# Patient Record
Sex: Male | Born: 1935
Health system: Southern US, Community
[De-identification: ages and names within clinical notes are randomized; demographics above are authoritative.]

## PROBLEM LIST (undated history)

## (undated) DIAGNOSIS — J439 Emphysema, unspecified: Secondary | ICD-10-CM

## (undated) DIAGNOSIS — K648 Other hemorrhoids: Secondary | ICD-10-CM

## (undated) DIAGNOSIS — E785 Hyperlipidemia, unspecified: Secondary | ICD-10-CM

## (undated) DIAGNOSIS — D649 Anemia, unspecified: Secondary | ICD-10-CM

## (undated) DIAGNOSIS — IMO0001 Reserved for inherently not codable concepts without codable children: Secondary | ICD-10-CM

## (undated) DIAGNOSIS — C61 Malignant neoplasm of prostate: Secondary | ICD-10-CM

## (undated) DIAGNOSIS — J449 Chronic obstructive pulmonary disease, unspecified: Secondary | ICD-10-CM

## (undated) DIAGNOSIS — D126 Benign neoplasm of colon, unspecified: Secondary | ICD-10-CM

## (undated) DIAGNOSIS — H269 Unspecified cataract: Secondary | ICD-10-CM

## (undated) HISTORY — DX: Unspecified cataract: H26.9

## (undated) HISTORY — DX: Emphysema, unspecified: J43.9

## (undated) HISTORY — DX: Anemia, unspecified: D64.9

## (undated) HISTORY — DX: Benign neoplasm of colon, unspecified: D12.6

## (undated) HISTORY — DX: Other hemorrhoids: K64.8

## (undated) HISTORY — DX: Malignant neoplasm of prostate: C61

## (undated) HISTORY — DX: Reserved for inherently not codable concepts without codable children: IMO0001

## (undated) HISTORY — DX: Hyperlipidemia, unspecified: E78.5

## (undated) HISTORY — DX: Chronic obstructive pulmonary disease, unspecified: J44.9

---

## 2003-06-15 DIAGNOSIS — D126 Benign neoplasm of colon, unspecified: Secondary | ICD-10-CM

## 2003-06-15 HISTORY — DX: Benign neoplasm of colon, unspecified: D12.6

## 2004-05-13 ENCOUNTER — Ambulatory Visit (HOSPITAL_COMMUNITY): Admission: RE | Admit: 2004-05-13 | Discharge: 2004-05-13 | Payer: Self-pay | Admitting: *Deleted

## 2004-07-10 ENCOUNTER — Encounter: Admission: RE | Admit: 2004-07-10 | Discharge: 2004-07-10 | Payer: Self-pay | Admitting: Urology

## 2004-08-17 ENCOUNTER — Ambulatory Visit: Admission: RE | Admit: 2004-08-17 | Discharge: 2004-11-15 | Payer: Self-pay | Admitting: Radiation Oncology

## 2004-11-16 ENCOUNTER — Ambulatory Visit: Admission: RE | Admit: 2004-11-16 | Discharge: 2005-02-14 | Payer: Self-pay | Admitting: Radiation Oncology

## 2005-12-08 ENCOUNTER — Observation Stay (HOSPITAL_COMMUNITY): Admission: AD | Admit: 2005-12-08 | Discharge: 2005-12-11 | Payer: Self-pay | Admitting: Internal Medicine

## 2006-06-14 HISTORY — PX: PROSTATECTOMY: SHX69

## 2006-08-26 ENCOUNTER — Ambulatory Visit (HOSPITAL_COMMUNITY): Admission: RE | Admit: 2006-08-26 | Discharge: 2006-08-26 | Payer: Self-pay | Admitting: *Deleted

## 2011-07-19 ENCOUNTER — Other Ambulatory Visit: Payer: Self-pay | Admitting: Internal Medicine

## 2011-12-09 ENCOUNTER — Encounter: Payer: Self-pay | Admitting: Gastroenterology

## 2012-01-07 ENCOUNTER — Other Ambulatory Visit: Payer: Self-pay

## 2012-01-13 ENCOUNTER — Ambulatory Visit (INDEPENDENT_AMBULATORY_CARE_PROVIDER_SITE_OTHER): Payer: Medicare Other | Admitting: Gastroenterology

## 2012-01-13 ENCOUNTER — Encounter: Payer: Self-pay | Admitting: Gastroenterology

## 2012-01-13 VITALS — BP 130/70 | HR 76 | Ht 65.0 in | Wt 142.8 lb

## 2012-01-13 DIAGNOSIS — Z86010 Personal history of colonic polyps: Secondary | ICD-10-CM

## 2012-01-13 DIAGNOSIS — D649 Anemia, unspecified: Secondary | ICD-10-CM

## 2012-01-13 MED ORDER — MOVIPREP 100 G PO SOLR: ORAL | Status: DC

## 2012-01-13 NOTE — Progress Notes (Signed)
History of Present Illness: This is a 78 year old male here today with his wife. He was previously followed by Dr. Virginia Rochester and has a history of tubulovillous adenomatous colon polyp diagnosed in 2005. He underwent a followup colonoscopy in 2008 that was free of polyps. I have records from his 2008 colonoscopy but no pathology or procedure note from his 2005 colonoscopy. He was recently found to have a normocytic anemia with a closed 12.8 iron studies and ferritin is were normal. Stool Hemoccults were negative. Denies weight loss, abdominal pain, constipation, diarrhea, change in stool caliber, melena, hematochezia, nausea, vomiting, dysphagia, reflux symptoms, chest pain.  Not on File Outpatient Prescriptions Prior to Visit  Medication Sig Dispense Refill  . aspirin 81 MG tablet Take 81 mg by mouth daily.      Marland Kitchen atorvastatin (LIPITOR) 20 MG tablet Take 10 mg by mouth daily.       . clotrimazole-betamethasone (LOTRISONE) cream Apply 1 application topically 2 (two) times daily.      . metFORMIN (GLUCOPHAGE) 1000 MG tablet Take 500 mg by mouth 2 (two) times daily with a meal.       . ramipril (ALTACE) 5 MG capsule Take 2.5 mg by mouth daily.        Past Medical History  Diagnosis Date  . Prostate cancer   . Uncontrolled diabetes mellitus with microalbuminuria or microproteinuria   . Hyperlipidemia   . Tubulovillous adenoma of colon 2005  . Cataracts, bilateral   . Hypertension   . Anemia   . Internal hemorrhoids    History reviewed. No pertinent past surgical history. History   Social History  . Marital Status: Married    Spouse Name: N/A    Number of Children: 3  . Years of Education: N/A   Occupational History  . Retired    Social History Main Topics  . Smoking status: Former Games developer  . Smokeless tobacco: Never Used  . Alcohol Use: No  . Drug Use: No  . Sexually Active: None   Other Topics Concern  . None   Social History Narrative  . None   Family History  Problem Relation  Age of Onset  . Colon cancer Father   . Stomach cancer Mother     Review of Systems: Pertinent positive and negative review of systems were noted in the above HPI section. All other review of systems were otherwise negative.  Current Medications, Allergies, Past Medical History, Past Surgical History, Family History and Social History were reviewed in [employer] record.  Physical Exam: General: Well developed , well nourished, no acute distress Head: Normocephalic and atraumatic Eyes:  sclerae anicteric, EOMI Ears: Normal auditory acuity Mouth: No deformity or lesions Neck: Supple, no masses or thyromegaly Lungs: Clear throughout to auscultation Heart: Regular rate and rhythm; no murmurs, rubs or bruits Abdomen: Soft, non tender and non distended. No masses, hepatosplenomegaly or hernias noted. Normal Bowel sounds Rectal: deferred to colonoscopy Musculoskeletal: Symmetrical with no gross deformities  Skin: No lesions on visible extremities Pulses:  Normal pulses noted Extremities: No clubbing, cyanosis, edema or deformities noted Neurological: Alert oriented x 4, grossly nonfocal Cervical Nodes:  No significant cervical adenopathy Inguinal Nodes: No significant inguinal adenopathy Psychological:  Alert and cooperative. Normal mood and affect  Assessment and Recommendations:  1. Personal history of tubulovillous adenomatous colon polyps, diagnosed in 2005. He is due for a five-year surveillance colonoscopy. The risks, benefits, and alternatives to colonoscopy with possible biopsy and possible polypectomy were discussed with  the patient and they consent to proceed.   2. Normocytic anemia with normal iron studies and Hemoccult negative stool. Unlikely to be from a gastrointestinal source of bleeding. Further evaluation at colonoscopy.

## 2012-01-13 NOTE — Patient Instructions (Addendum)
You have been scheduled for a colonoscopy with propofol. Please follow written instructions given to you at your visit today.  Please pick up your prep kit at the pharmacy today. If you use inhalers (even only as needed), please bring them with you on the day of your procedure. CC: Dr Juline Patch

## 2012-01-14 ENCOUNTER — Ambulatory Visit (AMBULATORY_SURGERY_CENTER): Payer: Medicare Other | Admitting: Gastroenterology

## 2012-01-14 ENCOUNTER — Encounter: Payer: Self-pay | Admitting: Gastroenterology

## 2012-01-14 VITALS — BP 122/76 | HR 60 | Temp 96.0°F | Resp 20 | Ht 65.0 in | Wt 142.0 lb

## 2012-01-14 DIAGNOSIS — Z860100 Personal history of colon polyps, unspecified: Secondary | ICD-10-CM

## 2012-01-14 DIAGNOSIS — D649 Anemia, unspecified: Secondary | ICD-10-CM

## 2012-01-14 DIAGNOSIS — Z1211 Encounter for screening for malignant neoplasm of colon: Secondary | ICD-10-CM

## 2012-01-14 DIAGNOSIS — D126 Benign neoplasm of colon, unspecified: Secondary | ICD-10-CM

## 2012-01-14 LAB — GLUCOSE, CAPILLARY: Glucose-Capillary: 136 mg/dL — ABNORMAL HIGH (ref 70–99)

## 2012-01-14 MED ORDER — SODIUM CHLORIDE 0.9 % IV SOLN: 500.0000 mL | INTRAVENOUS | Status: DC

## 2012-01-14 NOTE — Patient Instructions (Signed)
Colon polyps x3 removed today, hemorrhoids seen. See handouts. Repeat colonoscopy in 3 years. Thank you!!  YOU HAD AN ENDOSCOPIC PROCEDURE TODAY AT THE [facility]: Refer to the procedure report that was given to you for any specific questions about what was found during the examination.  If the procedure report does not answer your questions, please call your gastroenterologist to clarify.  If you requested that your care partner not be given the details of your procedure findings, then the procedure report has been included in a sealed envelope for you to review at your convenience later.  YOU SHOULD EXPECT: Some feelings of bloating in the abdomen. Passage of more gas than usual.  Walking can help get rid of the air that was put into your GI tract during the procedure and reduce the bloating. If you had a lower endoscopy (such as a colonoscopy or flexible sigmoidoscopy) you may notice spotting of blood in your stool or on the toilet paper. If you underwent a bowel prep for your procedure, then you may not have a normal bowel movement for a few days.  DIET: Your first meal following the procedure should be a light meal and then it is ok to progress to your normal diet.  A half-sandwich or bowl of soup is an example of a good first meal.  Heavy or fried foods are harder to digest and may make you feel nauseous or bloated.  Likewise meals heavy in dairy and vegetables can cause extra gas to form and this can also increase the bloating.  Drink plenty of fluids but you should avoid alcoholic beverages for 24 hours.  ACTIVITY: Your care partner should take you home directly after the procedure.  You should plan to take it easy, moving slowly for the rest of the day.  You can resume normal activity the day after the procedure however you should NOT DRIVE or use heavy machinery for 24 hours (because of the sedation medicines used during the test).    SYMPTOMS TO REPORT IMMEDIATELY: A  gastroenterologist can be reached at any hour.  During normal business hours, 8:30 AM to 5:00 PM Monday through Friday, call [phone].  After hours and on weekends, please call the GI answering service at [phone] who will take a message and have the physician on call contact you.   Following lower endoscopy (colonoscopy or flexible sigmoidoscopy):  Excessive amounts of blood in the stool  Significant tenderness or worsening of abdominal pains  Swelling of the abdomen that is new, acute  Fever of 100F or higher  FOLLOW UP: If any biopsies were taken you will be contacted by phone or by letter within the next 1-3 weeks.  Call your gastroenterologist if you have not heard about the biopsies in 3 weeks.  Our staff will call the home number listed on your records the next business day following your procedure to check on you and address any questions or concerns that you may have at that time regarding the information given to you following your procedure. This is a courtesy call and so if there is no answer at the home number and we have not heard from you through the emergency physician on call, we will assume that you have returned to your regular daily activities without incident.  SIGNATURES/CONFIDENTIALITY: You and/or your care partner have signed paperwork which will be entered into your electronic medical record.  These signatures attest to the fact that that the information above on your  After Visit Summary has been reviewed and is understood.  Full responsibility of the confidentiality of this discharge information lies with you and/or your care-partner.

## 2012-01-14 NOTE — Op Note (Addendum)
[facility] [address] [place], [place]  [address]  COLONOSCOPY PROCEDURE REPORT  PATIENT:  Martin Bates, Martin Bates  MR#:  [id number] BIRTHDATE:  1931-04-03, 80 yrs. old  GENDER:  male ENDOSCOPIST:  Judie Petit T. Russella Dar, MD, [facility] Referred by:  Juline Patch, M.D. PROCEDURE DATE:  01/14/2012 PROCEDURE:  Colonoscopy with snare polypectomy ASA CLASS:  Class II INDICATIONS:  1) history of pre-cancerous (adenomatous) colon polyps: TVA 2005 MEDICATIONS:   MAC sedation, administered by CRNA, propofol (Diprivan) 110 mg IV DESCRIPTION OF PROCEDURE:   After the risks benefits and alternatives of the procedure were thoroughly explained, informed consent was obtained.  Digital rectal exam was performed and revealed no abnormalities.   The LB [id number] endoscope was introduced through the anus and advanced to the cecum, which was identified by both the appendix and ileocecal valve, without limitations.  The quality of the prep was good, using MoviPrep. The instrument was then slowly withdrawn as the colon was fully examined. <<PROCEDUREIMAGES>> FINDINGS:  A non-bleeding, 5 mm AVM was found in the cecum. Two polyps were found in the ascending colon. They were 6 mm in size. Polyps were snared without cautery. Retrieval was successful. A sessile polyp was found in the sigmoid colon. It was 5 mm in size. Polyp was snared without cautery. Retrieval was successful. Otherwise normal colonoscopy without other polyps, masses, vascular ectasias, or inflammatory changes. Retroflexed views in the rectum revealed internal hemorrhoids, small. The time to cecum =  3.5  minutes. The scope was then withdrawn (time =  9.25  min) from the patient and the procedure completed.  COMPLICATIONS:  None  ENDOSCOPIC IMPRESSION: 1) 6 mm Two polyps in the ascending colon 2) 5 mm sessile polyp in the sigmoid colon 3) AVM in the cecum 4) Internal hemorrhoids  RECOMMENDATIONS: 1) Await pathology results 2)  Repeat Colonoscopy in 3 years if health status is appropriate.  Venita Lick. Russella Dar, MD, [facility]  n. REVISED:  01/14/2012 02:17 PM eSIGNED:   Venita Lick. Montine Hight at 01/14/2012 02:17 PM  Sherron Monday, [phone]

## 2012-01-14 NOTE — Progress Notes (Signed)
Patient did not experience any of the following events: a burn prior to discharge; a fall within the facility; wrong site/side/patient/procedure/implant event; or a hospital transfer or hospital admission upon discharge from the facility. (G8907) Patient did not have preoperative order for IV antibiotic SSI prophylaxis. (G8918)  

## 2012-01-17 ENCOUNTER — Telehealth: Payer: Self-pay

## 2012-01-17 NOTE — Telephone Encounter (Signed)
  Follow up Call-  Call back number 01/14/2012  Post procedure Call Back phone  # [phone]  Permission to leave phone message Yes     Patient questions:  Do you have a fever, pain , or abdominal swelling? no Pain Score  0 *  Have you tolerated food without any problems? yes  Have you been able to return to your normal activities? yes  Do you have any questions about your discharge instructions: Diet   no Medications  no Follow up visit  no  Do you have questions or concerns about your Care? no  Actions: * If pain score is 4 or above: No action needed, pain <4.

## 2012-01-18 ENCOUNTER — Encounter: Payer: Self-pay | Admitting: Gastroenterology

## 2013-03-26 ENCOUNTER — Ambulatory Visit: Payer: Self-pay | Admitting: Podiatry

## 2014-06-17 DIAGNOSIS — E118 Type 2 diabetes mellitus with unspecified complications: Secondary | ICD-10-CM | POA: Diagnosis not present

## 2014-06-17 DIAGNOSIS — Z0000 Encounter for general adult medical examination without abnormal findings: Secondary | ICD-10-CM | POA: Diagnosis not present

## 2014-06-19 DIAGNOSIS — Z0001 Encounter for general adult medical examination with abnormal findings: Secondary | ICD-10-CM | POA: Diagnosis not present

## 2014-06-19 DIAGNOSIS — E780 Pure hypercholesterolemia: Secondary | ICD-10-CM | POA: Diagnosis not present

## 2014-06-19 DIAGNOSIS — E1165 Type 2 diabetes mellitus with hyperglycemia: Secondary | ICD-10-CM | POA: Diagnosis not present

## 2014-09-10 DIAGNOSIS — L237 Allergic contact dermatitis due to plants, except food: Secondary | ICD-10-CM | POA: Diagnosis not present

## 2014-10-30 DIAGNOSIS — Z8546 Personal history of malignant neoplasm of prostate: Secondary | ICD-10-CM | POA: Diagnosis not present

## 2014-11-06 DIAGNOSIS — Z8546 Personal history of malignant neoplasm of prostate: Secondary | ICD-10-CM | POA: Diagnosis not present

## 2014-12-09 ENCOUNTER — Encounter: Payer: Self-pay | Admitting: Gastroenterology

## 2014-12-18 DIAGNOSIS — E1165 Type 2 diabetes mellitus with hyperglycemia: Secondary | ICD-10-CM | POA: Diagnosis not present

## 2014-12-24 DIAGNOSIS — E119 Type 2 diabetes mellitus without complications: Secondary | ICD-10-CM | POA: Diagnosis not present

## 2014-12-24 DIAGNOSIS — E785 Hyperlipidemia, unspecified: Secondary | ICD-10-CM | POA: Diagnosis not present

## 2014-12-24 DIAGNOSIS — R197 Diarrhea, unspecified: Secondary | ICD-10-CM | POA: Diagnosis not present

## 2015-02-05 DIAGNOSIS — E119 Type 2 diabetes mellitus without complications: Secondary | ICD-10-CM | POA: Diagnosis not present

## 2015-02-05 DIAGNOSIS — Z23 Encounter for immunization: Secondary | ICD-10-CM | POA: Diagnosis not present

## 2015-02-05 DIAGNOSIS — E780 Pure hypercholesterolemia: Secondary | ICD-10-CM | POA: Diagnosis not present

## 2015-02-11 DIAGNOSIS — E780 Pure hypercholesterolemia: Secondary | ICD-10-CM | POA: Diagnosis not present

## 2015-02-11 DIAGNOSIS — E119 Type 2 diabetes mellitus without complications: Secondary | ICD-10-CM | POA: Diagnosis not present

## 2015-02-25 DIAGNOSIS — E119 Type 2 diabetes mellitus without complications: Secondary | ICD-10-CM | POA: Diagnosis not present

## 2015-03-18 DIAGNOSIS — E119 Type 2 diabetes mellitus without complications: Secondary | ICD-10-CM | POA: Diagnosis not present

## 2015-05-19 DIAGNOSIS — Z8546 Personal history of malignant neoplasm of prostate: Secondary | ICD-10-CM | POA: Diagnosis not present

## 2015-05-19 DIAGNOSIS — E291 Testicular hypofunction: Secondary | ICD-10-CM | POA: Diagnosis not present

## 2015-05-26 DIAGNOSIS — E291 Testicular hypofunction: Secondary | ICD-10-CM | POA: Diagnosis not present

## 2015-05-26 DIAGNOSIS — Z8546 Personal history of malignant neoplasm of prostate: Secondary | ICD-10-CM | POA: Diagnosis not present

## 2015-06-18 DIAGNOSIS — E118 Type 2 diabetes mellitus with unspecified complications: Secondary | ICD-10-CM | POA: Diagnosis not present

## 2015-06-18 DIAGNOSIS — E7800 Pure hypercholesterolemia, unspecified: Secondary | ICD-10-CM | POA: Diagnosis not present

## 2015-06-18 DIAGNOSIS — Z0000 Encounter for general adult medical examination without abnormal findings: Secondary | ICD-10-CM | POA: Diagnosis not present

## 2015-06-18 DIAGNOSIS — E119 Type 2 diabetes mellitus without complications: Secondary | ICD-10-CM | POA: Diagnosis not present

## 2015-06-25 DIAGNOSIS — R197 Diarrhea, unspecified: Secondary | ICD-10-CM | POA: Diagnosis not present

## 2015-06-25 DIAGNOSIS — E7800 Pure hypercholesterolemia, unspecified: Secondary | ICD-10-CM | POA: Diagnosis not present

## 2015-06-25 DIAGNOSIS — E119 Type 2 diabetes mellitus without complications: Secondary | ICD-10-CM | POA: Diagnosis not present

## 2015-06-25 DIAGNOSIS — E118 Type 2 diabetes mellitus with unspecified complications: Secondary | ICD-10-CM | POA: Diagnosis not present

## 2015-06-25 DIAGNOSIS — E785 Hyperlipidemia, unspecified: Secondary | ICD-10-CM | POA: Diagnosis not present

## 2015-06-27 DIAGNOSIS — I10 Essential (primary) hypertension: Secondary | ICD-10-CM | POA: Diagnosis not present

## 2015-06-27 DIAGNOSIS — R9431 Abnormal electrocardiogram [ECG] [EKG]: Secondary | ICD-10-CM | POA: Diagnosis not present

## 2015-06-27 DIAGNOSIS — E119 Type 2 diabetes mellitus without complications: Secondary | ICD-10-CM | POA: Diagnosis not present

## 2015-06-27 DIAGNOSIS — E785 Hyperlipidemia, unspecified: Secondary | ICD-10-CM | POA: Diagnosis not present

## 2015-07-14 DIAGNOSIS — R9431 Abnormal electrocardiogram [ECG] [EKG]: Secondary | ICD-10-CM | POA: Diagnosis not present

## 2015-10-23 DIAGNOSIS — I351 Nonrheumatic aortic (valve) insufficiency: Secondary | ICD-10-CM | POA: Diagnosis not present

## 2015-10-23 DIAGNOSIS — I10 Essential (primary) hypertension: Secondary | ICD-10-CM | POA: Diagnosis not present

## 2015-10-23 DIAGNOSIS — I119 Hypertensive heart disease without heart failure: Secondary | ICD-10-CM | POA: Diagnosis not present

## 2015-10-23 DIAGNOSIS — E118 Type 2 diabetes mellitus with unspecified complications: Secondary | ICD-10-CM | POA: Diagnosis not present

## 2015-12-22 DIAGNOSIS — Z8546 Personal history of malignant neoplasm of prostate: Secondary | ICD-10-CM | POA: Diagnosis not present

## 2015-12-26 DIAGNOSIS — Z8546 Personal history of malignant neoplasm of prostate: Secondary | ICD-10-CM | POA: Diagnosis not present

## 2015-12-26 DIAGNOSIS — E291 Testicular hypofunction: Secondary | ICD-10-CM | POA: Diagnosis not present

## 2016-01-02 DIAGNOSIS — H40003 Preglaucoma, unspecified, bilateral: Secondary | ICD-10-CM | POA: Diagnosis not present

## 2016-01-02 DIAGNOSIS — E119 Type 2 diabetes mellitus without complications: Secondary | ICD-10-CM | POA: Diagnosis not present

## 2016-01-02 DIAGNOSIS — H02839 Dermatochalasis of unspecified eye, unspecified eyelid: Secondary | ICD-10-CM | POA: Diagnosis not present

## 2016-01-02 DIAGNOSIS — Z961 Presence of intraocular lens: Secondary | ICD-10-CM | POA: Diagnosis not present

## 2016-02-23 DIAGNOSIS — Z23 Encounter for immunization: Secondary | ICD-10-CM | POA: Diagnosis not present

## 2016-02-23 DIAGNOSIS — C61 Malignant neoplasm of prostate: Secondary | ICD-10-CM | POA: Diagnosis not present

## 2016-02-23 DIAGNOSIS — E119 Type 2 diabetes mellitus without complications: Secondary | ICD-10-CM | POA: Diagnosis not present

## 2016-06-25 DIAGNOSIS — E118 Type 2 diabetes mellitus with unspecified complications: Secondary | ICD-10-CM | POA: Diagnosis not present

## 2016-06-25 DIAGNOSIS — Z0000 Encounter for general adult medical examination without abnormal findings: Secondary | ICD-10-CM | POA: Diagnosis not present

## 2016-06-25 DIAGNOSIS — I10 Essential (primary) hypertension: Secondary | ICD-10-CM | POA: Diagnosis not present

## 2016-06-25 DIAGNOSIS — E7800 Pure hypercholesterolemia, unspecified: Secondary | ICD-10-CM | POA: Diagnosis not present

## 2016-07-05 DIAGNOSIS — Z0001 Encounter for general adult medical examination with abnormal findings: Secondary | ICD-10-CM | POA: Diagnosis not present

## 2016-07-05 DIAGNOSIS — Z1212 Encounter for screening for malignant neoplasm of rectum: Secondary | ICD-10-CM | POA: Diagnosis not present

## 2016-07-05 DIAGNOSIS — D649 Anemia, unspecified: Secondary | ICD-10-CM | POA: Diagnosis not present

## 2016-07-05 DIAGNOSIS — E785 Hyperlipidemia, unspecified: Secondary | ICD-10-CM | POA: Diagnosis not present

## 2016-07-05 DIAGNOSIS — E118 Type 2 diabetes mellitus with unspecified complications: Secondary | ICD-10-CM | POA: Diagnosis not present

## 2016-10-19 LAB — GLUCOSE, POCT (MANUAL RESULT ENTRY): POC GLUCOSE: 110 mg/dL — AB (ref 70–99)

## 2016-10-24 ENCOUNTER — Encounter: Payer: Self-pay | Admitting: *Deleted

## 2016-10-24 DIAGNOSIS — Z139 Encounter for screening, unspecified: Secondary | ICD-10-CM

## 2016-11-20 DIAGNOSIS — L237 Allergic contact dermatitis due to plants, except food: Secondary | ICD-10-CM | POA: Diagnosis not present

## 2016-11-25 DIAGNOSIS — M79642 Pain in left hand: Secondary | ICD-10-CM | POA: Diagnosis not present

## 2016-11-25 DIAGNOSIS — M79641 Pain in right hand: Secondary | ICD-10-CM | POA: Diagnosis not present

## 2016-11-25 DIAGNOSIS — M65341 Trigger finger, right ring finger: Secondary | ICD-10-CM | POA: Diagnosis not present

## 2016-11-25 DIAGNOSIS — M65342 Trigger finger, left ring finger: Secondary | ICD-10-CM | POA: Diagnosis not present

## 2016-12-22 DIAGNOSIS — M65341 Trigger finger, right ring finger: Secondary | ICD-10-CM | POA: Diagnosis not present

## 2016-12-22 DIAGNOSIS — M79641 Pain in right hand: Secondary | ICD-10-CM | POA: Diagnosis not present

## 2016-12-22 DIAGNOSIS — M79642 Pain in left hand: Secondary | ICD-10-CM | POA: Diagnosis not present

## 2016-12-22 DIAGNOSIS — M65342 Trigger finger, left ring finger: Secondary | ICD-10-CM | POA: Diagnosis not present

## 2016-12-30 DIAGNOSIS — D649 Anemia, unspecified: Secondary | ICD-10-CM | POA: Diagnosis not present

## 2016-12-30 DIAGNOSIS — E118 Type 2 diabetes mellitus with unspecified complications: Secondary | ICD-10-CM | POA: Diagnosis not present

## 2017-01-05 ENCOUNTER — Encounter: Payer: Self-pay | Admitting: Gastroenterology

## 2017-01-05 DIAGNOSIS — E118 Type 2 diabetes mellitus with unspecified complications: Secondary | ICD-10-CM | POA: Diagnosis not present

## 2017-01-05 DIAGNOSIS — D649 Anemia, unspecified: Secondary | ICD-10-CM | POA: Diagnosis not present

## 2017-01-05 DIAGNOSIS — Z86010 Personal history of colonic polyps: Secondary | ICD-10-CM | POA: Diagnosis not present

## 2017-01-20 DIAGNOSIS — H40003 Preglaucoma, unspecified, bilateral: Secondary | ICD-10-CM | POA: Diagnosis not present

## 2017-01-20 DIAGNOSIS — H02839 Dermatochalasis of unspecified eye, unspecified eyelid: Secondary | ICD-10-CM | POA: Diagnosis not present

## 2017-01-20 DIAGNOSIS — E119 Type 2 diabetes mellitus without complications: Secondary | ICD-10-CM | POA: Diagnosis not present

## 2017-01-20 DIAGNOSIS — Z961 Presence of intraocular lens: Secondary | ICD-10-CM | POA: Diagnosis not present

## 2017-01-25 DIAGNOSIS — E291 Testicular hypofunction: Secondary | ICD-10-CM | POA: Diagnosis not present

## 2017-02-02 DIAGNOSIS — R9721 Rising PSA following treatment for malignant neoplasm of prostate: Secondary | ICD-10-CM | POA: Diagnosis not present

## 2017-02-02 DIAGNOSIS — C61 Malignant neoplasm of prostate: Secondary | ICD-10-CM | POA: Diagnosis not present

## 2017-02-02 DIAGNOSIS — E291 Testicular hypofunction: Secondary | ICD-10-CM | POA: Diagnosis not present

## 2017-03-03 ENCOUNTER — Ambulatory Visit: Payer: Self-pay | Admitting: Gastroenterology

## 2017-03-21 ENCOUNTER — Ambulatory Visit (INDEPENDENT_AMBULATORY_CARE_PROVIDER_SITE_OTHER): Payer: Medicare Other | Admitting: Gastroenterology

## 2017-03-21 ENCOUNTER — Encounter: Payer: Self-pay | Admitting: Gastroenterology

## 2017-03-21 ENCOUNTER — Encounter (INDEPENDENT_AMBULATORY_CARE_PROVIDER_SITE_OTHER): Payer: Self-pay

## 2017-03-21 VITALS — BP 118/68 | HR 64 | Ht 64.57 in | Wt 134.0 lb

## 2017-03-21 DIAGNOSIS — Z86010 Personal history of colonic polyps: Secondary | ICD-10-CM | POA: Diagnosis not present

## 2017-03-21 DIAGNOSIS — R634 Abnormal weight loss: Secondary | ICD-10-CM

## 2017-03-21 MED ORDER — NA SULFATE-K SULFATE-MG SULF 17.5-3.13-1.6 GM/177ML PO SOLN: 1.0000 | Freq: Once | ORAL | 0 refills | Status: AC

## 2017-03-21 NOTE — Patient Instructions (Signed)
You have been scheduled for a colonoscopy. Please follow written instructions given to you at your visit today.  Please pick up your prep supplies at the pharmacy within the next 1-3 days. If you use inhalers (even only as needed), please bring them with you on the day of your procedure. Your physician has requested that you go to www.startemmi.com and enter the access code given to you at your visit today. This web site gives a general overview about your procedure. However, you should still follow specific instructions given to you by our office regarding your preparation for the procedure.  Thank you for choosing me and [facility].  Malcolm T. Stark, Jr., MD., FACG  

## 2017-03-21 NOTE — Progress Notes (Signed)
History of Present Illness: This is an 83 year old male referred by Deland Pretty, MD for the evaluation of weight loss and personal history of adenomatous colon polyps. He is accompanied by his wife. The patient notes a very gradual weight loss over several years. Per our office records he has had an 8 pound weight loss since his last visit here 5 years ago. He has no digestive complaints. Denies weight loss, abdominal pain, constipation, diarrhea, change in stool caliber, melena, hematochezia, nausea, vomiting, dysphagia, reflux symptoms, chest pain.   Colonoscopy 01/2012:  3 small polyps (tubular adenomas), AVM, internal hemorrhoids.    No Known Allergies Outpatient Medications Prior to Visit  Medication Sig Dispense Refill  . aspirin 81 MG tablet Take 81 mg by mouth daily.    Marland Kitchen atorvastatin (LIPITOR) 20 MG tablet Take 10 mg by mouth daily.     . metFORMIN (GLUCOPHAGE) 1000 MG tablet Take 500 mg by mouth 2 (two) times daily with a meal.     . clotrimazole-betamethasone (LOTRISONE) cream Apply 1 application topically 2 (two) times daily.    . ramipril (ALTACE) 5 MG capsule Take 2.5 mg by mouth daily.      No facility-administered medications prior to visit.    Past Medical History:  Diagnosis Date  . Anemia   . Cataracts, bilateral   . COPD (chronic obstructive pulmonary disease) ([place])   . Hyperlipidemia   . Hypertension   . Internal hemorrhoids   . Prostate cancer ([place])   . Pulmonary emphysema ([place])   . Tubulovillous adenoma of colon 2005  . Uncontrolled diabetes mellitus with microalbuminuria or microproteinuria    Past Surgical History:  Procedure Laterality Date  . PROSTATECTOMY  2008   Social History   Social History  . Marital status: Married    Spouse name: N/A  . Number of children: 3  . Years of education: N/A   Occupational History  . Retired    Social History Main Topics  . Smoking status: Former Research scientist (life sciences)  . Smokeless tobacco: Never Used  . Alcohol use  No  . Drug use: No  . Sexual activity: Not Asked   Other Topics Concern  . None   Social History Narrative  . None   Family History  Problem Relation Age of Onset  . Colon cancer Father   . Stomach cancer Mother       Review of Systems: Pertinent positive and negative review of systems were noted in the above HPI section. All other review of systems were otherwise negative.   Physical Exam: General: Well developed, well nourished, thin, no acute distress Head: Normocephalic and atraumatic Eyes:  sclerae anicteric, EOMI Ears: Normal auditory acuity Mouth: No deformity or lesions Neck: Supple, no masses or thyromegaly Lungs: Clear throughout to auscultation Heart: Regular rate and rhythm; no murmurs, rubs or bruits Abdomen: Soft, non tender and non distended. No masses, hepatosplenomegaly or hernias noted. Normal Bowel sounds Rectal: deferred to colonoscopy Musculoskeletal: Symmetrical with no gross deformities  Skin: No lesions on visible extremities Pulses:  Normal pulses noted Extremities: No clubbing, cyanosis, edema or deformities noted Neurological: Alert oriented x 4, grossly nonfocal Cervical Nodes:  No significant cervical adenopathy Inguinal Nodes: No significant inguinal adenopathy Psychological:  Alert and cooperative. Normal mood and affect  Assessment and Recommendations:  1.  Weight loss, gradual, mild. Today's BMI=22.60. Personal history of adenomatous colon polyps. Schedule colonoscopy. The risks (including bleeding, perforation, infection, missed lesions, medication reactions and possible hospitalization or  surgery if complications occur), benefits, and alternatives to colonoscopy with possible biopsy and possible polypectomy were discussed with the patient and they consent to proceed.    cc: Deland Pretty, MD [address] [place], [place] [address]

## 2017-03-30 ENCOUNTER — Encounter: Payer: Self-pay | Admitting: Gastroenterology

## 2017-03-30 ENCOUNTER — Ambulatory Visit (AMBULATORY_SURGERY_CENTER): Payer: Medicare Other | Admitting: Gastroenterology

## 2017-03-30 VITALS — BP 119/76 | HR 67 | Temp 97.5°F | Resp 16 | Ht 65.0 in | Wt 142.0 lb

## 2017-03-30 DIAGNOSIS — Z86010 Personal history of colonic polyps: Secondary | ICD-10-CM | POA: Diagnosis present

## 2017-03-30 DIAGNOSIS — D123 Benign neoplasm of transverse colon: Secondary | ICD-10-CM

## 2017-03-30 HISTORY — PX: COLONOSCOPY W/ POLYPECTOMY: SHX1380

## 2017-03-30 MED ORDER — SODIUM CHLORIDE 0.9 % IV SOLN: 500.0000 mL | INTRAVENOUS | Status: DC

## 2017-03-30 NOTE — Progress Notes (Signed)
Called to room to assist during endoscopic procedure.  Patient ID and intended procedure confirmed with present staff. Received instructions for my participation in the procedure from the performing physician.  

## 2017-03-30 NOTE — Op Note (Signed)
[facility] Patient Name: Martin Bates Procedure Date: 03/30/2017 10:52 AM MRN: [id number] Endoscopist: Ladene Artist , MD Age: 83 Referring MD:  Date of Birth: December 14, 1930 Gender: Male Account #: [id number] Procedure:                Colonoscopy Indications:              Surveillance: Personal history of adenomatous                            polyps on last colonoscopy 5 years ago. weight loss. Medicines:                Monitored Anesthesia Care Procedure:                Pre-Anesthesia Assessment:                           - Prior to the procedure, a History and Physical                            was performed, and patient medications and                            allergies were reviewed. The patient's tolerance of                            previous anesthesia was also reviewed. The risks                            and benefits of the procedure and the sedation                            options and risks were discussed with the patient.                            All questions were answered, and informed consent                            was obtained. Prior Anticoagulants: The patient has                            taken no previous anticoagulant or antiplatelet                            agents. ASA Grade Assessment: II - A patient with                            mild systemic disease. After reviewing the risks                            and benefits, the patient was deemed in                            satisfactory condition to undergo the procedure.  After obtaining informed consent, the colonoscope                            was passed under direct vision. Throughout the                            procedure, the patient's blood pressure, pulse, and                            oxygen saturations were monitored continuously. The                            Colonoscope was introduced through the anus and                            advanced to the  the cecum, identified by                            appendiceal orifice and ileocecal valve. The                            ileocecal valve, appendiceal orifice, and rectum                            were photographed. The quality of the bowel                            preparation was excellent. The colonoscopy was                            performed without difficulty. The patient tolerated                            the procedure well. Scope In: 11:09:50 AM Scope Out: 11:23:31 AM Scope Withdrawal Time: 0 hours 9 minutes 57 seconds  Total Procedure Duration: 0 hours 13 minutes 41 seconds  Findings:                 The perianal and digital rectal examinations were                            normal.                           A 4 mm polyp was found in the transverse colon. The                            polyp was sessile. The polyp was removed with a                            cold biopsy forceps. Resection and retrieval were                            complete.  A single medium-sized localized angiodysplastic                            lesion without bleeding was found in the cecum.                           Internal hemorrhoids were found during                            retroflexion. The hemorrhoids were small and Grade                            I (internal hemorrhoids that do not prolapse).                           The exam was otherwise without abnormality on                            direct and retroflexion views. Complications:            No immediate complications. Estimated blood loss:                            None. Estimated Blood Loss:     Estimated blood loss: none. Impression:               - One 4 mm polyp in the transverse colon, removed                            with a cold biopsy forceps. Resected and retrieved.                           - A single non-bleeding colonic angiodysplastic                            lesion.                            - Internal hemorrhoids.                           - The examination was otherwise normal on direct                            and retroflexion views. Recommendation:           - Patient has a contact number available for                            emergencies. The signs and symptoms of potential                            delayed complications were discussed with the                            patient. Return to normal activities tomorrow.  Written discharge instructions were provided to the                            patient.                           - Resume previous diet.                           - Continue present medications.                           - Await pathology results.                           - No repeat colonoscopy due to age. Ladene Artist, MD 03/30/2017 11:28:09 AM This report has been signed electronically.

## 2017-03-30 NOTE — Patient Instructions (Signed)
Colon polyp x 1 removed. Result letter in your mail in 2-3 weeks. Handouts given on polyps, hemorrhoids. Resume current medications.  Call us with any questions or concerns. Thank you!  YOU HAD AN ENDOSCOPIC PROCEDURE TODAY AT [facility]:   Refer to the procedure report that was given to you for any specific questions about what was found during the examination.  If the procedure report does not answer your questions, please call your gastroenterologist to clarify.  If you requested that your care partner not be given the details of your procedure findings, then the procedure report has been included in a sealed envelope for you to review at your convenience later.  YOU SHOULD EXPECT: Some feelings of bloating in the abdomen. Passage of more gas than usual.  Walking can help get rid of the air that was put into your GI tract during the procedure and reduce the bloating. If you had a lower endoscopy (such as a colonoscopy or flexible sigmoidoscopy) you may notice spotting of blood in your stool or on the toilet paper. If you underwent a bowel prep for your procedure, you may not have a normal bowel movement for a few days.  Please Note:  You might notice some irritation and congestion in your nose or some drainage.  This is from the oxygen used during your procedure.  There is no need for concern and it should clear up in a day or so.  SYMPTOMS TO REPORT IMMEDIATELY:   Following lower endoscopy (colonoscopy or flexible sigmoidoscopy):  Excessive amounts of blood in the stool  Significant tenderness or worsening of abdominal pains  Swelling of the abdomen that is new, acute  Fever of 100F or higher  For urgent or emergent issues, a gastroenterologist can be reached at any hour by calling [phone].   DIET:  We do recommend a small meal at first, but then you may proceed to your regular diet.  Drink plenty of fluids but you should avoid alcoholic beverages for 24  hours.  ACTIVITY:  You should plan to take it easy for the rest of today and you should NOT DRIVE or use heavy machinery until tomorrow (because of the sedation medicines used during the test).    FOLLOW UP: Our staff will call the number listed on your records the next business day following your procedure to check on you and address any questions or concerns that you may have regarding the information given to you following your procedure. If we do not reach you, we will leave a message.  However, if you are feeling well and you are not experiencing any problems, there is no need to return our call.  We will assume that you have returned to your regular daily activities without incident.  If any biopsies were taken you will be contacted by phone or by letter within the next 1-3 weeks.  Please call us at [phone] if you have not heard about the biopsies in 3 weeks.    SIGNATURES/CONFIDENTIALITY: You and/or your care partner have signed paperwork which will be entered into your electronic medical record.  These signatures attest to the fact that that the information above on your After Visit Summary has been reviewed and is understood.  Full responsibility of the confidentiality of this discharge information lies with you and/or your care-partner.

## 2017-03-30 NOTE — Progress Notes (Signed)
Report to PACU, RN, vss, BBS= Clear.  

## 2017-03-30 NOTE — Progress Notes (Signed)
Pt's states no medical or surgical changes since previsit or office visit. 

## 2017-03-31 ENCOUNTER — Telehealth: Payer: Self-pay | Admitting: *Deleted

## 2017-03-31 NOTE — Telephone Encounter (Signed)
No answer, message left for the patient. 

## 2017-03-31 NOTE — Telephone Encounter (Signed)
  Follow up Call-  Call back number 03/30/2017  Post procedure Call Back phone  # [phone]  Permission to leave phone message Yes  Some recent data might be hidden     Patient questions:  Do you have a fever, pain , or abdominal swelling? No. Pain Score  0 *  Have you tolerated food without any problems? Yes.    Have you been able to return to your normal activities? Yes.    Do you have any questions about your discharge instructions: Diet   No. Medications  No. Follow up visit  No.  Do you have questions or concerns about your Care? No.  Actions: * If pain score is 4 or above: No action needed, pain <4.

## 2017-04-08 ENCOUNTER — Encounter: Payer: Self-pay | Admitting: Gastroenterology

## 2017-05-02 DIAGNOSIS — C61 Malignant neoplasm of prostate: Secondary | ICD-10-CM | POA: Diagnosis not present

## 2017-05-13 ENCOUNTER — Other Ambulatory Visit: Payer: Self-pay | Admitting: Urology

## 2017-05-13 DIAGNOSIS — C61 Malignant neoplasm of prostate: Secondary | ICD-10-CM

## 2017-05-13 DIAGNOSIS — R9721 Rising PSA following treatment for malignant neoplasm of prostate: Secondary | ICD-10-CM | POA: Diagnosis not present

## 2017-05-13 DIAGNOSIS — R81 Glycosuria: Secondary | ICD-10-CM | POA: Diagnosis not present

## 2017-06-09 ENCOUNTER — Encounter (HOSPITAL_COMMUNITY): Payer: Self-pay

## 2017-06-09 ENCOUNTER — Encounter (HOSPITAL_COMMUNITY): Payer: Medicare Other

## 2017-07-01 ENCOUNTER — Encounter (HOSPITAL_COMMUNITY): Payer: Medicare Other

## 2017-07-07 ENCOUNTER — Encounter (HOSPITAL_COMMUNITY)
Admission: RE | Admit: 2017-07-07 | Discharge: 2017-07-07 | Disposition: A | Discharge status: Home / Self Care | Payer: Medicare Other | Source: Ambulatory Visit | Attending: Urology | Admitting: Urology

## 2017-07-07 DIAGNOSIS — C61 Malignant neoplasm of prostate: Secondary | ICD-10-CM | POA: Diagnosis not present

## 2017-07-07 MED ORDER — AXUMIN (FLUCICLOVINE F 18) INJECTION: 9.8900 | Freq: Once | INTRAVENOUS | Status: DC

## 2017-07-20 DIAGNOSIS — C61 Malignant neoplasm of prostate: Secondary | ICD-10-CM | POA: Diagnosis not present

## 2017-08-09 DIAGNOSIS — I10 Essential (primary) hypertension: Secondary | ICD-10-CM | POA: Diagnosis not present

## 2017-08-09 DIAGNOSIS — E7800 Pure hypercholesterolemia, unspecified: Secondary | ICD-10-CM | POA: Diagnosis not present

## 2017-08-09 DIAGNOSIS — E118 Type 2 diabetes mellitus with unspecified complications: Secondary | ICD-10-CM | POA: Diagnosis not present

## 2017-08-23 DIAGNOSIS — Z0000 Encounter for general adult medical examination without abnormal findings: Secondary | ICD-10-CM | POA: Diagnosis not present

## 2017-08-23 DIAGNOSIS — E785 Hyperlipidemia, unspecified: Secondary | ICD-10-CM | POA: Diagnosis not present

## 2017-08-23 DIAGNOSIS — D649 Anemia, unspecified: Secondary | ICD-10-CM | POA: Diagnosis not present

## 2017-08-23 DIAGNOSIS — E1121 Type 2 diabetes mellitus with diabetic nephropathy: Secondary | ICD-10-CM | POA: Diagnosis not present

## 2017-08-24 DIAGNOSIS — R002 Palpitations: Secondary | ICD-10-CM | POA: Diagnosis not present

## 2017-08-24 DIAGNOSIS — I499 Cardiac arrhythmia, unspecified: Secondary | ICD-10-CM | POA: Diagnosis not present

## 2017-08-24 DIAGNOSIS — R0989 Other specified symptoms and signs involving the circulatory and respiratory systems: Secondary | ICD-10-CM | POA: Diagnosis not present

## 2017-08-25 DIAGNOSIS — R9721 Rising PSA following treatment for malignant neoplasm of prostate: Secondary | ICD-10-CM | POA: Diagnosis not present

## 2017-08-25 DIAGNOSIS — C61 Malignant neoplasm of prostate: Secondary | ICD-10-CM | POA: Diagnosis not present

## 2017-08-31 DIAGNOSIS — R9431 Abnormal electrocardiogram [ECG] [EKG]: Secondary | ICD-10-CM | POA: Diagnosis not present

## 2017-09-07 DIAGNOSIS — I10 Essential (primary) hypertension: Secondary | ICD-10-CM | POA: Diagnosis not present

## 2017-09-07 DIAGNOSIS — I119 Hypertensive heart disease without heart failure: Secondary | ICD-10-CM | POA: Diagnosis not present

## 2017-09-07 DIAGNOSIS — R0989 Other specified symptoms and signs involving the circulatory and respiratory systems: Secondary | ICD-10-CM | POA: Diagnosis not present

## 2017-09-07 DIAGNOSIS — I499 Cardiac arrhythmia, unspecified: Secondary | ICD-10-CM | POA: Diagnosis not present

## 2017-09-22 DIAGNOSIS — C61 Malignant neoplasm of prostate: Secondary | ICD-10-CM | POA: Diagnosis not present

## 2017-09-27 DIAGNOSIS — C61 Malignant neoplasm of prostate: Secondary | ICD-10-CM | POA: Diagnosis not present

## 2017-09-29 ENCOUNTER — Other Ambulatory Visit: Payer: Self-pay | Admitting: Urology

## 2017-10-17 ENCOUNTER — Encounter (HOSPITAL_BASED_OUTPATIENT_CLINIC_OR_DEPARTMENT_OTHER): Payer: Self-pay

## 2017-10-17 DIAGNOSIS — M65341 Trigger finger, right ring finger: Secondary | ICD-10-CM | POA: Diagnosis not present

## 2017-10-17 DIAGNOSIS — M79641 Pain in right hand: Secondary | ICD-10-CM | POA: Diagnosis not present

## 2017-10-19 ENCOUNTER — Other Ambulatory Visit: Payer: Self-pay

## 2017-10-19 ENCOUNTER — Encounter (HOSPITAL_BASED_OUTPATIENT_CLINIC_OR_DEPARTMENT_OTHER): Payer: Self-pay

## 2017-10-19 NOTE — Progress Notes (Signed)
Spoke with:  Percell Miller NPO:  After Midnight, no gum, candy, or mints   Arrival time:  [id number] Labs/Procedures: Istat4, EKG AM medications:  Atorvastatin Pre op orders: Yes Ride home:  Esau Grew (wife) unsure of phone number will bring day of surgery

## 2017-10-24 ENCOUNTER — Encounter (HOSPITAL_BASED_OUTPATIENT_CLINIC_OR_DEPARTMENT_OTHER)
Admission: RE | Disposition: A | Discharge status: Home / Self Care | Payer: Self-pay | Source: Ambulatory Visit | Attending: Urology

## 2017-10-24 ENCOUNTER — Encounter (HOSPITAL_BASED_OUTPATIENT_CLINIC_OR_DEPARTMENT_OTHER): Payer: Self-pay | Admitting: Anesthesiology

## 2017-10-24 ENCOUNTER — Other Ambulatory Visit: Payer: Self-pay

## 2017-10-24 ENCOUNTER — Ambulatory Visit (HOSPITAL_BASED_OUTPATIENT_CLINIC_OR_DEPARTMENT_OTHER): Payer: Medicare Other | Admitting: Anesthesiology

## 2017-10-24 ENCOUNTER — Ambulatory Visit (HOSPITAL_BASED_OUTPATIENT_CLINIC_OR_DEPARTMENT_OTHER)
Admission: RE | Admit: 2017-10-24 | Discharge: 2017-10-24 | Disposition: A | Discharge status: Home / Self Care | Payer: Medicare Other | Source: Ambulatory Visit | Attending: Urology | Admitting: Urology

## 2017-10-24 DIAGNOSIS — Z7984 Long term (current) use of oral hypoglycemic drugs: Secondary | ICD-10-CM | POA: Diagnosis not present

## 2017-10-24 DIAGNOSIS — Z923 Personal history of irradiation: Secondary | ICD-10-CM | POA: Diagnosis not present

## 2017-10-24 DIAGNOSIS — Z8546 Personal history of malignant neoplasm of prostate: Secondary | ICD-10-CM | POA: Diagnosis not present

## 2017-10-24 DIAGNOSIS — E119 Type 2 diabetes mellitus without complications: Secondary | ICD-10-CM | POA: Insufficient documentation

## 2017-10-24 DIAGNOSIS — Z87891 Personal history of nicotine dependence: Secondary | ICD-10-CM | POA: Diagnosis not present

## 2017-10-24 DIAGNOSIS — C61 Malignant neoplasm of prostate: Secondary | ICD-10-CM | POA: Insufficient documentation

## 2017-10-24 DIAGNOSIS — J449 Chronic obstructive pulmonary disease, unspecified: Secondary | ICD-10-CM | POA: Diagnosis not present

## 2017-10-24 DIAGNOSIS — Z7982 Long term (current) use of aspirin: Secondary | ICD-10-CM | POA: Insufficient documentation

## 2017-10-24 DIAGNOSIS — E291 Testicular hypofunction: Secondary | ICD-10-CM | POA: Insufficient documentation

## 2017-10-24 DIAGNOSIS — D649 Anemia, unspecified: Secondary | ICD-10-CM | POA: Diagnosis not present

## 2017-10-24 DIAGNOSIS — Z79899 Other long term (current) drug therapy: Secondary | ICD-10-CM | POA: Diagnosis not present

## 2017-10-24 HISTORY — PX: CRYOABLATION: SHX1415

## 2017-10-24 LAB — POCT I-STAT, CHEM 8
BUN: 19 mg/dL (ref 6–20)
CREATININE: 0.8 mg/dL (ref 0.61–1.24)
Calcium, Ion: 1.23 mmol/L (ref 1.15–1.40)
Chloride: 101 mmol/L (ref 101–111)
Glucose, Bld: 130 mg/dL — ABNORMAL HIGH (ref 65–99)
HEMATOCRIT: 39 % (ref 39.0–52.0)
Hemoglobin: 13.3 g/dL (ref 13.0–17.0)
POTASSIUM: 3.9 mmol/L (ref 3.5–5.1)
SODIUM: 141 mmol/L (ref 135–145)
TCO2: 28 mmol/L (ref 22–32)

## 2017-10-24 LAB — GLUCOSE, CAPILLARY: Glucose-Capillary: 114 mg/dL — ABNORMAL HIGH (ref 65–99)

## 2017-10-24 SURGERY — CRYOABLATION, PROSTATE
Anesthesia: General | Site: Prostate

## 2017-10-24 MED ORDER — LIDOCAINE 2% (20 MG/ML) 5 ML SYRINGE
INTRAMUSCULAR | Status: DC | PRN
  Administered 2017-10-24: 50 mg via INTRAVENOUS

## 2017-10-24 MED ORDER — CEFAZOLIN SODIUM-DEXTROSE 2-4 GM/100ML-% IV SOLN
2.0000 g | INTRAVENOUS | Status: AC
  Administered 2017-10-24: 2 g via INTRAVENOUS
  Filled 2017-10-24: qty 100

## 2017-10-24 MED ORDER — PROPOFOL 10 MG/ML IV BOLUS
INTRAVENOUS | Status: AC
  Filled 2017-10-24: qty 20

## 2017-10-24 MED ORDER — SODIUM CHLORIDE 0.9 % IV SOLN
INTRAVENOUS | Status: DC
  Administered 2017-10-24: 09:00:00 via INTRAVENOUS
  Filled 2017-10-24: qty 1000

## 2017-10-24 MED ORDER — PROPOFOL 10 MG/ML IV BOLUS
INTRAVENOUS | Status: DC | PRN
  Administered 2017-10-24: 100 mg via INTRAVENOUS

## 2017-10-24 MED ORDER — GLYCOPYRROLATE 0.2 MG/ML IV SOSY
PREFILLED_SYRINGE | INTRAVENOUS | Status: DC | PRN
  Administered 2017-10-24: .2 mg via INTRAVENOUS

## 2017-10-24 MED ORDER — HYDROCODONE-ACETAMINOPHEN 5-325 MG PO TABS: 1.0000 | ORAL_TABLET | ORAL | 0 refills | Status: DC | PRN

## 2017-10-24 MED ORDER — PHENYLEPHRINE HCL 10 MG/ML IJ SOLN
INTRAVENOUS | Status: DC | PRN
  Administered 2017-10-24: 20 ug/min via INTRAVENOUS

## 2017-10-24 MED ORDER — FENTANYL CITRATE (PF) 100 MCG/2ML IJ SOLN
25.0000 ug | INTRAMUSCULAR | Status: DC | PRN
  Filled 2017-10-24: qty 1

## 2017-10-24 MED ORDER — PROMETHAZINE HCL 25 MG/ML IJ SOLN
6.2500 mg | INTRAMUSCULAR | Status: DC | PRN
  Filled 2017-10-24: qty 1

## 2017-10-24 MED ORDER — MEPERIDINE HCL 25 MG/ML IJ SOLN
6.2500 mg | INTRAMUSCULAR | Status: DC | PRN
  Filled 2017-10-24: qty 1

## 2017-10-24 MED ORDER — WHITE PETROLATUM EX OINT
TOPICAL_OINTMENT | CUTANEOUS | Status: AC
  Filled 2017-10-24: qty 5

## 2017-10-24 MED ORDER — FENTANYL CITRATE (PF) 100 MCG/2ML IJ SOLN
INTRAMUSCULAR | Status: DC | PRN
  Administered 2017-10-24: 25 ug via INTRAVENOUS
  Administered 2017-10-24: 50 ug via INTRAVENOUS

## 2017-10-24 MED ORDER — OXYCODONE HCL 5 MG/5ML PO SOLN
5.0000 mg | Freq: Once | ORAL | Status: DC | PRN
  Filled 2017-10-24: qty 5

## 2017-10-24 MED ORDER — PHENYLEPHRINE 40 MCG/ML (10ML) SYRINGE FOR IV PUSH (FOR BLOOD PRESSURE SUPPORT)
PREFILLED_SYRINGE | INTRAVENOUS | Status: AC
  Filled 2017-10-24: qty 10

## 2017-10-24 MED ORDER — GLYCOPYRROLATE 0.2 MG/ML IV SOSY
PREFILLED_SYRINGE | INTRAVENOUS | Status: AC
  Filled 2017-10-24: qty 5

## 2017-10-24 MED ORDER — PHENYLEPHRINE 40 MCG/ML (10ML) SYRINGE FOR IV PUSH (FOR BLOOD PRESSURE SUPPORT)
PREFILLED_SYRINGE | INTRAVENOUS | Status: DC | PRN
  Administered 2017-10-24 (×3): 40 ug via INTRAVENOUS

## 2017-10-24 MED ORDER — ONDANSETRON HCL 4 MG/2ML IJ SOLN
INTRAMUSCULAR | Status: AC
  Filled 2017-10-24: qty 2

## 2017-10-24 MED ORDER — PHENYLEPHRINE HCL 10 MG/ML IJ SOLN
INTRAMUSCULAR | Status: AC
  Filled 2017-10-24: qty 1

## 2017-10-24 MED ORDER — ONDANSETRON HCL 4 MG/2ML IJ SOLN
INTRAMUSCULAR | Status: DC | PRN
  Administered 2017-10-24: 4 mg via INTRAVENOUS

## 2017-10-24 MED ORDER — CEFAZOLIN SODIUM-DEXTROSE 2-4 GM/100ML-% IV SOLN
INTRAVENOUS | Status: AC
  Filled 2017-10-24: qty 100

## 2017-10-24 MED ORDER — STERILE WATER FOR IRRIGATION IR SOLN
Status: DC | PRN
  Administered 2017-10-24: 3000 mL

## 2017-10-24 MED ORDER — FENTANYL CITRATE (PF) 100 MCG/2ML IJ SOLN
INTRAMUSCULAR | Status: AC
  Filled 2017-10-24: qty 2

## 2017-10-24 MED ORDER — OXYCODONE HCL 5 MG PO TABS
5.0000 mg | ORAL_TABLET | Freq: Once | ORAL | Status: DC | PRN
  Filled 2017-10-24: qty 1

## 2017-10-24 MED ORDER — SODIUM CHLORIDE 0.9 % IR SOLN
Status: DC | PRN
  Administered 2017-10-24: 1000 mL

## 2017-10-24 MED ORDER — BELLADONNA ALKALOIDS-OPIUM 16.2-60 MG RE SUPP
RECTAL | Status: AC
  Filled 2017-10-24: qty 1

## 2017-10-24 SURGICAL SUPPLY — 37 items
BAG DRN ANRFLXCHMBR STRAP LEK (BAG)
BAG URINE DRAINAGE (UROLOGICAL SUPPLIES) ×2 IMPLANT
BAG URINE LEG 19OZ MD ST LTX (BAG) IMPLANT
BLADE CLIPPER SURG (BLADE) ×3 IMPLANT
BNDG CONFORM 3 STRL LF (GAUZE/BANDAGES/DRESSINGS) IMPLANT
BOOTIES KNEE HIGH SLOAN (MISCELLANEOUS) ×3 IMPLANT
CATH FOLEY 2WAY SLVR  5CC 16FR (CATHETERS) ×2
CATH FOLEY 2WAY SLVR 5CC 16FR (CATHETERS) ×1 IMPLANT
CHARGE TECH PROCEDURE ONCURA (LABOR (TRAVEL & OVERTIME)) ×3 IMPLANT
CLOTH BEACON ORANGE TIMEOUT ST (SAFETY) ×3 IMPLANT
COVER BACK TABLE 60X90IN (DRAPES) ×3 IMPLANT
COVER MAYO STAND STRL (DRAPES) ×3 IMPLANT
DRAPE INCISE IOBAN 66X45 STRL (DRAPES) ×3 IMPLANT
DRSG TEGADERM 4X4.75 (GAUZE/BANDAGES/DRESSINGS) ×3 IMPLANT
DRSG TEGADERM 8X12 (GAUZE/BANDAGES/DRESSINGS) IMPLANT
GAS ARGON HIGH PRESSURE (MEDICAL GASES) ×3 IMPLANT
GAS HELIUM HIGH PRESSURE (MEDICAL GASES) ×3 IMPLANT
GAUZE SPONGE 4X4 12PLY STRL (GAUZE/BANDAGES/DRESSINGS) ×3 IMPLANT
GLOVE BIO SURGEON STRL SZ7.5 (GLOVE) ×3 IMPLANT
GUIDEWIRE SUPER STIFF (WIRE) ×3 IMPLANT
HOLDER FOLEY CATH W/STRAP (MISCELLANEOUS) ×3 IMPLANT
IV NS 1000ML (IV SOLUTION) ×3
IV NS 1000ML BAXH (IV SOLUTION) IMPLANT
KIT CRYO ENDOCARE (DISPOSABLE) ×2 IMPLANT
KIT PROSTATE PRESICE I (KITS) IMPLANT
KIT TURNOVER CYSTO (KITS) ×3 IMPLANT
MANIFOLD NEPTUNE II (INSTRUMENTS) IMPLANT
NEEDLE SPNL 18GX3.5 QUINCKE PK (NEEDLE) IMPLANT
PACK CYSTO (CUSTOM PROCEDURE TRAY) ×3 IMPLANT
PLUG CATH AND CAP STER (CATHETERS) ×3 IMPLANT
SYRINGE 10CC LL (SYRINGE) IMPLANT
SYRINGE IRR TOOMEY STRL 70CC (SYRINGE) IMPLANT
TUBE CONNECTING 12'X1/4 (SUCTIONS)
TUBE CONNECTING 12X1/4 (SUCTIONS) IMPLANT
UNDERPAD 30X30 (UNDERPADS AND DIAPERS) ×3 IMPLANT
WATER STERILE IRR 3000ML UROMA (IV SOLUTION) ×2 IMPLANT
WATER STERILE IRR 500ML POUR (IV SOLUTION) ×3 IMPLANT

## 2017-10-24 NOTE — Anesthesia Preprocedure Evaluation (Signed)
Anesthesia Evaluation  Patient identified by MRN, date of birth, ID band Patient awake    Reviewed: Allergy & Precautions, NPO status , Patient's Chart, lab work & pertinent test results  Airway Mallampati: II  TM Distance: >3 FB Neck ROM: Full    Dental no notable dental hx.    Pulmonary COPD, former smoker,    Pulmonary exam normal breath sounds clear to auscultation       Cardiovascular negative cardio ROS Normal cardiovascular exam Rhythm:Regular Rate:Normal     Neuro/Psych negative neurological ROS  negative psych ROS   GI/Hepatic negative GI ROS, Neg liver ROS,   Endo/Other  negative endocrine ROSdiabetes  Renal/GU Renal disease     Musculoskeletal negative musculoskeletal ROS (+)   Abdominal   Peds  Hematology negative hematology ROS (+) anemia ,   Anesthesia Other Findings   Reproductive/Obstetrics                             Anesthesia Physical Anesthesia Plan  ASA: III  Anesthesia Plan: General   Post-op Pain Management:    Induction: Intravenous  PONV Risk Score and Plan: 3 and Ondansetron, Dexamethasone and Treatment may vary due to age or medical condition  Airway Management Planned: LMA  Additional Equipment:   Intra-op Plan:   Post-operative Plan: Extubation in OR  Informed Consent: I have reviewed the patients History and Physical, chart, labs and discussed the procedure including the risks, benefits and alternatives for the proposed anesthesia with the patient or authorized representative who has indicated his/her understanding and acceptance.   Dental advisory given  Plan Discussed with: CRNA  Anesthesia Plan Comments:         Anesthesia Quick Evaluation

## 2017-10-24 NOTE — Op Note (Addendum)
Operative Note  Preoperative diagnosis:  1.  Prostate cancer recurrence, clinical stage T1C  Postoperative diagnosis: 1.  Prostate cancer recurrence, clinical stage T1C  Procedure(s): 1.  Salvage cryotherapy of the prostate 2.  Cystoscopy   Surgeon: Link Snuffer, MD  Assistants: None  Anesthesia: General  Complications: None immediate  EBL: Minimal  Specimens: 1.  None  Drains/Catheters: 1.  Foley catheter  Intraoperative findings: 1.  Normal urethra.  Prostate was short and small and appeared nonobstructive.  Bladder mucosa was free of any masses.  Prostate was successfully frozen and thawed for 2 cycles.    Indication: 84 year old male with history of Gleason 7 prostate cancer treated with combination radiation therapy and androgen deprivation therapy.  He was noted to have increasing PSA level consistent with biochemical recurrence.  He underwent a PET scan that was negative for metastatic disease but showed uptake in the prostate consistent with recurrence.  He underwent a repeat prostate biopsy that revealed Gleason 4+4 recurrent prostate cancer.  He was extensively counseled on different options and ultimately elected to undergo salvage cryotherapy.  Description of procedure:  The patient was identified and consent was obtained.  The patient was brought to the operating room, and placed upon the operating table in the dorsal supine position, where general LMA anesthesia was introduced. He was then replaced in the dorsal lithotomy position, where the pubis was prepped and draped in usual fashion. Foley catheter was placed, and the scrotum was taped to the abdominal wall.  A timeout was performed. The patient had been given IV antibiotic.  6 probes were placed in a standard fashion.  4 were placed posteriorly in the prostate and 2 were placed anteriorly.  These were placed under ultrasound guidance.  Temperature probes were placed in denonvier's fascia and another at the  external sphincter.  After confirming with ultrasound that the probes were in proper position, the Foley catheter was removed.  Flexible cystoscopy was performed with the findings noted above.  Retroflexion confirmed that no probes had pierced into the bladder.  A Super Stiff wire was advanced into the bladder and the scope withdrawn.  A urethral warming device was advanced into the bladder and left in place for the entire procedure, and not removed for 15 minutes after the second thaw cycle was begun.  2 freeze then thaw cycles were performed.  After 15 minutes of warming, the urethral warming device was removed, and a Foley catheter was placed to straight drainage. The patient tolerated the procedure well and was awakened and taken to recovery room in good condition.   Plan: Follow-up in 1 week for voiding trial

## 2017-10-24 NOTE — Discharge Instructions (Addendum)
INSTRUCTIONS AFTER CRYOABLATION OF THE PROSTATE  Normal Findings After Cryoablation:   You may experience a number of symptoms after the cryoablation which occur in some patients.  There is no cause for alarm should this happen.  These symptoms include:  -Blood in the urine.  When you are discharged after your surgery, your urine will have some blood in it and may appear red.  This occurs to some degree in all patients after cryoablation and is not cause for alarm.  Your urine should clear approximately 24 hours after the procedure, but may persist for quite a while longer.  -Scrotal and penile swelling and bruising.  This occurs about 2-3 days after the cryoablation and is caused by tissue swelling which temporarily blocks the drainage of lymph.  This is painless and resolves in less than one week.  Ice packs and lying down for short periods of time during the day will improve the swelling.  -Small amounts of bloody discharge from the end of your penis.  This can occur for up to 6 weeks after the procedure and is not cause for alarm.  It is due to some discharge from the urethra in the area of the prostate.  -Some numbness in the head of the penis.  Occasionally, when a large amount of freezing has been performed during the procedure, the nerve which supplies sensation to one or both sides of the head of the penis may be affected. The sensation returns after a number of months.  Call your doctor at [phone] if any of the following occur:               -If you have any pain, fever, or chills.  -If your foley catheter or suprapubic tube is not draining urine.  -If there is decreasing urinary stream.  This may indicate sloughing of some dead tissue in the area of the prostate near the opening to the bladder.  It may clear on its own but, if the problem becomes severe, it may require removal of the dead tissue through a cystoscope.  -If you have diarrhea after urination or foul-smelling urine.  These  symptoms may indicate an urethrorectal fistula, which is a hole between the bladder channel and the rectum.  This should be investigated by your doctor.  -If you have any questions or problems.   Diet:  Resume your normal diet tomorrow. If you become constipated, you may try over-the-counter remedies such as Milk of Magnesia.  If you are nauseated, vomiting or feel bloated, notify your doctor's office.  DO NOT GIVE YOURSELF ANY ENEMAS OR RECTAL MEDICATIONS.  THE RECTAL WALL IS THIN AFTER CRYOABLATION.  Activity:   -You may have swelling and bruising of the penis and scrotum.  Apply ice packs to the area behind the scrotum intermittently for 24-48 hours after your surgery to keep the swelling down.  Wearing an athletic supporter (jock strap) might also help.  -Lying flat on your back will also help decrease the swelling.  You may find when you are sitting up or walking around that the swelling increases.   -You will have  puncture wounds behind your scrotum making it painful to sit down.  Use a hemorrhoid donut to sit on if needed.  -You may shower on the second day after surgery.  -There are no lifting or driving restrictions.  Use caution while the suprapubic tube  is in place.   Medications:  -You may resume your preoperative medications, except aspirin and other  blood thinning agents. Unless otherwise instructed, resume your aspirin after your suprapubic tube is removed. If you are taking Coumadin or other blood thinners, please discuss when to restart these medications with your surgeon.  -Unless otherwise ordered, you will be given prescriptions for the following medications which you will need to take after your procedure:   *Antibiotics -  to prevent infection while your suprapubic tube is in place.   *Anti-inflammatory - to prevent pain and to decrease the inflammation in the area of the prostate.  You may take ibuprofen (Motrin, Advil), acetaminophen (Tylenol) or naproxen (Aleve) as needed  for discomfort.    Post Anesthesia Home Care Instructions  Activity: Get plenty of rest for the remainder of the day. A responsible individual must stay with you for 24 hours following the procedure.  For the next 24 hours, DO NOT: -Drive a car -Paediatric nurse -Drink alcoholic beverages -Take any medication unless instructed by your physician -Make any legal decisions or sign important papers.  Meals: Start with liquid foods such as gelatin or soup. Progress to regular foods as tolerated. Avoid greasy, spicy, heavy foods. If nausea and/or vomiting occur, drink only clear liquids until the nausea and/or vomiting subsides. Call your physician if vomiting continues.  Special Instructions/Symptoms: Your throat may feel dry or sore from the anesthesia or the breathing tube placed in your throat during surgery. If this causes discomfort, gargle with warm salt water. The discomfort should disappear within 24 hours.  If you had a scopolamine patch placed behind your ear for the management of post- operative nausea and/or vomiting:  1. The medication in the patch is effective for 72 hours, after which it should be removed.  Wrap patch in a tissue and discard in the trash. Wash hands thoroughly with soap and water. 2. You may remove the patch earlier than 72 hours if you experience unpleasant side effects which may include dry mouth, dizziness or visual disturbances. 3. Avoid touching the patch. Wash your hands with soap and water after contact with the patch.

## 2017-10-24 NOTE — H&P (Signed)
CC: I have prostate cancer.  HPI: Martin Bates is a 84 year-old male established patient who is here evaluation for treatment of prostate cancer.  His prostate cancer was diagnosed approximately 12/13/2003. His most recent PSA is 3.08.   He has not undergone surgery for treatment. He has undergone External Beam Radiation Therapy for treatment. He has undergone Hormonal Therapy for treatment.   He does not have urinary incontinence. He has not recently had unwanted weight loss. He is not having pain in new locations.   07/20/17  Martin Bates returns today in f/u. He is an 84 year-old with a history of a T3, Gleason 7, 4+3, adenocarcinoma of the prostate that has been treated with androgen ablation therapy and radiation. He completed his radiation in 2005, and his last Lupron shot was is 2007. His PSA was up to 3.08 in 11/18 from 2.56 at his last visit and from 1.35 in 7/17. An Axumin PET scan was done but only showed uptake in the prostate gland. The last testosterone level was normal and stable at 314. He is voiding well witihout complaints other than nocturia 3-4x and daytime frequency. He denies hematuria. He is a diabetic and has 3+ glucose in the urine today. His glucose was 135 this am. He has no associated signs or symptoms.   08/25/17  Patient presents today as a referral discuss cryotherapy. He had an incomplete understanding during our conversation therefore he called his daughter-in-law and she was involved in the conversation as well. Her name is Martin Bates. She is a Marine scientist and we discussed cryotherapy in detail. The patient today had no complaints. As stated before, he has a history of Gleason 4+3 disease that was treated with radiation and ADT for 2 years which he completed in 2007. He now has evidence of a biochemical recurrence with a concerning doubling time. He had an Axumin PET scan that was negative for metastatic disease and showed uptake in the prostate. He has not had a repeat prostate biopsy.    09/22/17  I had multiple conversations with the patient as well as the family. After thorough discussion of different options, the patient has decided to proceed with salvage cryotherapy. Prior to consideration of this, he presents today for prostate biopsy.   09/27/17  Patient presents today after undergoing prostate biopsy. His prostate biopsy did confirm a localized recurrence 4 cores of Gleason 4+4 and up to 50% of the cores. These were all located on the right. He has no complaints today. He denies significant voiding complaints. He is still interested in cryotherapy.     ALLERGIES: No Allergies    MEDICATIONS: Metformin Hcl 500 mg tablet  Aspirin 81 MG TABS Oral  Atorvastatin Calcium 10 mg tablet Oral  Calcium + D TABS Oral  Daily Vitamin tablet Oral  Fish Oil 360 mg-1,200 mg capsule,delayed release Oral  Vitamin C TABS Oral     GU PSH: Prostate Needle Biopsy - 09/22/2017, 2008      PSH Notes: Biopsy Of The Prostate Needle, Radiation Therapy   NON-GU PSH: Surgical Pathology, Gross And Microscopic Examination For Prostate Needle - 09/22/2017    GU PMH: Prostate Cancer - 09/22/2017, (Worsening), The PET shows uptake only in the prostate. I discussed the options for therapy including possible cryotherapy or immediate vs delayed ADT. I am going to have him see Dr. Gloriann Loan about cryo but won't do a biopsy yet to confirm local recurrence unless he finds cryotherapy appealing. He will otherwise return in 3  months with a psa. I think he would be best served with delay ADT. , - 07/20/2017 (Worsening), His PSA continues to rise with a PSADT of about 29mo. I am going to restage him with an Manley PET and have him return with the results. , - 05/13/2017, His PSA has rising with a near doubling over the past year to 2.56 but his testosterone has finally normalized. I am going to repeat the labs in 3 months and restage if the level is still rising. , - 02/02/2017 Primary hypogonadism (Improving) -  02/02/2017, (Stable), His testosteorne remains low but fairly stable., - 12/26/2015, Hypogonadism, testicular, - 05/26/2015 Rising PSA after prostate cancer treatment (Worsening) - 02/02/2017 History of prostate cancer, History of prostate cancer - 05/26/2015, Prostate Cancer, - 10/04/2012    NON-GU PMH: Encounter for general adult medical examination without abnormal findings, Encounter for preventive health examination - 05-Oct-2014 Glycosuria, Glycosuria - 2013/10/04, Glycosuria, - Oct 04, 2012 Personal history of other endocrine, nutritional and metabolic disease, History of diabetes mellitus - 10-04-12 Personal history of other infectious and parasitic diseases, History of herpes zoster - 10/04/12    FAMILY HISTORY: Death In The Family Father - Father Death In The Family Mother - Mother Family Health Status Number - Runs In Family Gastric Cancer - Mother   SOCIAL HISTORY: Marital Status: Married Preferred Language: English; Ethnicity: Not Hispanic Or Latino; Race: Asian Current Smoking Status: Patient does not smoke anymore. Has not smoked since 12/12/1985.  Has never drank.  Does not drink caffeine.     Notes: Former smoker, Caffeine Use, Alcohol Use, Tobacco Use, Marital History - Currently Married, Occupation:   REVIEW OF SYSTEMS:    GU Review Male:   Patient denies frequent urination, hard to postpone urination, burning/ pain with urination, get up at night to urinate, leakage of urine, stream starts and stops, trouble starting your stream, have to strain to urinate , erection problems, and penile pain.  Gastrointestinal (Upper):   Patient denies nausea, vomiting, and indigestion/ heartburn.  Gastrointestinal (Lower):   Patient denies diarrhea and constipation.  Constitutional:   Patient denies fever, night sweats, weight loss, and fatigue.  Skin:   Patient denies skin rash/ lesion and itching.  Eyes:   Patient denies blurred vision and double vision.  Ears/ Nose/ Throat:   Patient denies sore throat and sinus  problems.  Hematologic/Lymphatic:   Patient denies swollen glands and easy bruising.  Cardiovascular:   Patient denies leg swelling and chest pains.  Respiratory:   Patient denies cough and shortness of breath.  Endocrine:   Patient denies excessive thirst.  Musculoskeletal:   Patient denies back pain and joint pain.  Neurological:   Patient denies headaches and dizziness.  Psychologic:   Patient denies depression and anxiety.   VITAL SIGNS: None   MULTI-SYSTEM PHYSICAL EXAMINATION:    Constitutional: Well-nourished. No physical deformities. Normally developed. Good grooming.  Respiratory: No labored breathing, no use of accessory muscles. Normal breath sounds.  Cardiovascular: Normal temperature, adequate perfusion of extremities  Skin: No paleness, no jaundice  Neurologic / Psychiatric: Oriented to time, oriented to place, oriented to person. No depression, no anxiety, no agitation.  Gastrointestinal: No mass, no tenderness, no rigidity, non obese abdomen.  Eyes: Normal conjunctivae. Normal eyelids.  Musculoskeletal: Normal gait and station of head and neck.     PAST DATA REVIEWED:  Source Of History:  Patient  Lab Test Review:   Path Report   08/25/17 05/02/17 01/25/17 12/22/15 05/20/15 10/30/14 04/22/14 01/14/14  PSA  Total PSA 3.26 ng/mL 3.08 ng/mL 2.56 ng/mL 1.35 ng/dl 1.31  1.38  1.05  1.21     05/02/17 01/25/17 12/22/15 05/19/15 04/22/14 05/06/08 10/03/06 05/30/06  Hormones  Testosterone, Total 314.2 ng/dL 317.2 ng/dL 192.6 pg/dL 205  221  299.0  3.10  1.44     PROCEDURES:          Urinalysis Dipstick Dipstick Cont'd  Color: Yellow Bilirubin: Neg  Appearance: Clear Ketones: Neg  Specific Gravity: 1.020 Blood: Neg  pH: 7.5 Protein: Trace  Glucose: Neg Urobilinogen: 0.2    Nitrites: Neg    Leukocyte Esterase: Neg    ASSESSMENT:      ICD-10 Details  1 GU:   Prostate Cancer - C61    PLAN:           Document Letter(s):  Created for Patient: Clinical Summary          Notes:   Again extensively discussed the option of cryotherapy which included the potential risks that include but are not limited to the devastating complication of fistula formation which is a higher risk in patients with a prior history of radiation. There is also other risks involved including urethral stricture formation, bleeding, infection, pain, new voiding complaints. Also discussed other options including observation, early initiation of ADT as well as initiation of ADT once there are signs of metastatic disease.   After full discussion, he would like to proceed with salvage cryotherapy of the prostate.   Contact: Bader Stubblefield [phone]   CC: Irine Seal, MD  Deland Pretty, MD        Next Appointment:      Next Appointment: 10/19/2017 10:15 AM    Appointment Type: Laboratory Appointment    Location: [facility] [phone]    Provider: Lab LAB    Reason for Visit: psa-wrenn      E & M CODE: I spent at least 15 minutes face to face with the patient, more than 50% of that time was spent on counseling and/or coordinating care.     Signed by Link Snuffer, III, M.D. on 09/27/17 at 3:30 PM (EDT

## 2017-10-24 NOTE — Interval H&P Note (Signed)
History and Physical Interval Note:  10/24/2017 9:56 AM  Martin Bates  has presented today for surgery, with the diagnosis of prostate cancer  The various methods of treatment have been discussed with the patient and family. After consideration of risks, benefits and other options for treatment, the patient has consented to  Procedure(s): CRYO ABLATION PROSTATE (N/A) as a surgical intervention .  The patient's history has been reviewed, patient examined, no change in status, stable for surgery.  I have reviewed the patient's chart and labs.  Questions were answered to the patient's satisfaction.     Marton Redwood, III

## 2017-10-24 NOTE — Anesthesia Procedure Notes (Signed)
Procedure Name: LMA Insertion Date/Time: 10/24/2017 11:24 AM Performed by: Suan Halter, CRNA Pre-anesthesia Checklist: Patient identified, Emergency Drugs available, Suction available and Patient being monitored Patient Re-evaluated:Patient Re-evaluated prior to induction Oxygen Delivery Method: Circle system utilized Preoxygenation: Pre-oxygenation with 100% oxygen Induction Type: IV induction Ventilation: Mask ventilation without difficulty LMA: LMA inserted LMA Size: 4.0 Number of attempts: 1 Airway Equipment and Method: Bite block Placement Confirmation: positive ETCO2 Tube secured with: Tape Dental Injury: Teeth and Oropharynx as per pre-operative assessment

## 2017-10-24 NOTE — Transfer of Care (Signed)
Immediate Anesthesia Transfer of Care Note  Patient: Martin Bates  Procedure(s) Performed: Procedure(s) (LRB): CRYO ABLATION PROSTATE (N/A)  Patient Location: PACU  Anesthesia Type: General  Level of Consciousness: awake, oriented, sedated and patient cooperative  Airway & Oxygen Therapy: Patient Spontanous Breathing and Patient connected to face mask oxygen  Post-op Assessment: Report given to PACU RN and Post -op Vital signs reviewed and stable  Post vital signs: Reviewed and stable  Complications: No apparent anesthesia complications Last Vitals:  Vitals Value Taken Time  BP    Temp    Pulse 64 10/24/2017 12:55 PM  Resp    SpO2 100 % 10/24/2017 12:55 PM  Vitals shown include unvalidated device data.  Last Pain:  Vitals:   10/24/17 0907  TempSrc:   PainSc: 5       Patients Stated Pain Goal: 5 (10/24/17 0907)

## 2017-10-25 NOTE — Anesthesia Postprocedure Evaluation (Signed)
Anesthesia Post Note  Patient: Martin Bates  Procedure(s) Performed: CRYO ABLATION PROSTATE (N/A Prostate)     Patient location during evaluation: PACU Anesthesia Type: General Level of consciousness: sedated and patient cooperative Pain management: pain level controlled Vital Signs Assessment: post-procedure vital signs reviewed and stable Respiratory status: spontaneous breathing Cardiovascular status: stable Anesthetic complications: no    Last Vitals:  Vitals:   10/24/17 1315 10/24/17 1445  BP: (!) 145/85 (!) 155/70  Pulse: 79 60  Resp: 15 14  Temp:  36.5 C  SpO2: 99% 99%    Last Pain:  Vitals:   10/25/17 1317  TempSrc:   PainSc: 0-No pain                 Nolon Nations

## 2017-10-26 ENCOUNTER — Encounter (HOSPITAL_BASED_OUTPATIENT_CLINIC_OR_DEPARTMENT_OTHER): Payer: Self-pay | Admitting: Urology

## 2017-10-31 DIAGNOSIS — R338 Other retention of urine: Secondary | ICD-10-CM | POA: Diagnosis not present

## 2017-10-31 DIAGNOSIS — C61 Malignant neoplasm of prostate: Secondary | ICD-10-CM | POA: Diagnosis not present

## 2017-11-09 DIAGNOSIS — R310 Gross hematuria: Secondary | ICD-10-CM | POA: Diagnosis not present

## 2017-11-14 DIAGNOSIS — C61 Malignant neoplasm of prostate: Secondary | ICD-10-CM | POA: Diagnosis not present

## 2017-11-14 DIAGNOSIS — R338 Other retention of urine: Secondary | ICD-10-CM | POA: Diagnosis not present

## 2017-11-23 DIAGNOSIS — R338 Other retention of urine: Secondary | ICD-10-CM | POA: Diagnosis not present

## 2018-01-27 DIAGNOSIS — H40013 Open angle with borderline findings, low risk, bilateral: Secondary | ICD-10-CM | POA: Diagnosis not present

## 2018-01-27 DIAGNOSIS — E119 Type 2 diabetes mellitus without complications: Secondary | ICD-10-CM | POA: Diagnosis not present

## 2018-02-24 DIAGNOSIS — Z23 Encounter for immunization: Secondary | ICD-10-CM | POA: Diagnosis not present

## 2018-03-29 DIAGNOSIS — N320 Bladder-neck obstruction: Secondary | ICD-10-CM | POA: Diagnosis not present

## 2018-04-17 DIAGNOSIS — R3912 Poor urinary stream: Secondary | ICD-10-CM | POA: Diagnosis not present

## 2018-05-09 DIAGNOSIS — R3912 Poor urinary stream: Secondary | ICD-10-CM | POA: Diagnosis not present

## 2018-08-24 DIAGNOSIS — E118 Type 2 diabetes mellitus with unspecified complications: Secondary | ICD-10-CM | POA: Diagnosis not present

## 2018-08-24 DIAGNOSIS — E7800 Pure hypercholesterolemia, unspecified: Secondary | ICD-10-CM | POA: Diagnosis not present

## 2018-08-29 DIAGNOSIS — J439 Emphysema, unspecified: Secondary | ICD-10-CM | POA: Diagnosis not present

## 2018-08-29 DIAGNOSIS — Z0000 Encounter for general adult medical examination without abnormal findings: Secondary | ICD-10-CM | POA: Diagnosis not present

## 2018-08-29 DIAGNOSIS — E1121 Type 2 diabetes mellitus with diabetic nephropathy: Secondary | ICD-10-CM | POA: Diagnosis not present

## 2018-09-21 ENCOUNTER — Other Ambulatory Visit: Payer: Self-pay

## 2018-09-21 ENCOUNTER — Encounter (HOSPITAL_COMMUNITY): Payer: Self-pay | Admitting: Emergency Medicine

## 2018-09-21 ENCOUNTER — Inpatient Hospital Stay (HOSPITAL_COMMUNITY)
Admission: EM | Admit: 2018-09-21 | Discharge: 2018-09-25 | DRG: 872 | Disposition: A | Discharge status: Home Health | Payer: Medicare Other | Attending: Internal Medicine | Admitting: Internal Medicine

## 2018-09-21 ENCOUNTER — Emergency Department (HOSPITAL_COMMUNITY): Payer: Medicare Other

## 2018-09-21 DIAGNOSIS — R7989 Other specified abnormal findings of blood chemistry: Secondary | ICD-10-CM | POA: Diagnosis not present

## 2018-09-21 DIAGNOSIS — A419 Sepsis, unspecified organism: Secondary | ICD-10-CM | POA: Diagnosis not present

## 2018-09-21 DIAGNOSIS — R6889 Other general symptoms and signs: Secondary | ICD-10-CM | POA: Diagnosis not present

## 2018-09-21 DIAGNOSIS — Z86010 Personal history of colonic polyps: Secondary | ICD-10-CM

## 2018-09-21 DIAGNOSIS — E119 Type 2 diabetes mellitus without complications: Secondary | ICD-10-CM

## 2018-09-21 DIAGNOSIS — E876 Hypokalemia: Secondary | ICD-10-CM | POA: Diagnosis not present

## 2018-09-21 DIAGNOSIS — R05 Cough: Secondary | ICD-10-CM | POA: Diagnosis not present

## 2018-09-21 DIAGNOSIS — D649 Anemia, unspecified: Secondary | ICD-10-CM | POA: Diagnosis not present

## 2018-09-21 DIAGNOSIS — R0902 Hypoxemia: Secondary | ICD-10-CM | POA: Diagnosis not present

## 2018-09-21 DIAGNOSIS — Z20822 Contact with and (suspected) exposure to covid-19: Secondary | ICD-10-CM | POA: Diagnosis present

## 2018-09-21 DIAGNOSIS — D72819 Decreased white blood cell count, unspecified: Secondary | ICD-10-CM | POA: Diagnosis not present

## 2018-09-21 DIAGNOSIS — Z7982 Long term (current) use of aspirin: Secondary | ICD-10-CM

## 2018-09-21 DIAGNOSIS — I2510 Atherosclerotic heart disease of native coronary artery without angina pectoris: Secondary | ICD-10-CM

## 2018-09-21 DIAGNOSIS — Z79899 Other long term (current) drug therapy: Secondary | ICD-10-CM

## 2018-09-21 DIAGNOSIS — Z8546 Personal history of malignant neoplasm of prostate: Secondary | ICD-10-CM

## 2018-09-21 DIAGNOSIS — R7881 Bacteremia: Secondary | ICD-10-CM | POA: Diagnosis not present

## 2018-09-21 DIAGNOSIS — E785 Hyperlipidemia, unspecified: Secondary | ICD-10-CM | POA: Diagnosis not present

## 2018-09-21 DIAGNOSIS — N179 Acute kidney failure, unspecified: Secondary | ICD-10-CM | POA: Diagnosis not present

## 2018-09-21 DIAGNOSIS — Z9079 Acquired absence of other genital organ(s): Secondary | ICD-10-CM

## 2018-09-21 DIAGNOSIS — Z20828 Contact with and (suspected) exposure to other viral communicable diseases: Secondary | ICD-10-CM | POA: Diagnosis present

## 2018-09-21 DIAGNOSIS — A4151 Sepsis due to Escherichia coli [E. coli]: Principal | ICD-10-CM | POA: Diagnosis present

## 2018-09-21 DIAGNOSIS — E872 Acidosis, unspecified: Secondary | ICD-10-CM

## 2018-09-21 DIAGNOSIS — R509 Fever, unspecified: Secondary | ICD-10-CM | POA: Diagnosis not present

## 2018-09-21 DIAGNOSIS — I248 Other forms of acute ischemic heart disease: Secondary | ICD-10-CM | POA: Diagnosis present

## 2018-09-21 DIAGNOSIS — Z7984 Long term (current) use of oral hypoglycemic drugs: Secondary | ICD-10-CM

## 2018-09-21 DIAGNOSIS — I471 Supraventricular tachycardia: Secondary | ICD-10-CM | POA: Diagnosis present

## 2018-09-21 DIAGNOSIS — J449 Chronic obstructive pulmonary disease, unspecified: Secondary | ICD-10-CM | POA: Diagnosis present

## 2018-09-21 DIAGNOSIS — D709 Neutropenia, unspecified: Secondary | ICD-10-CM | POA: Diagnosis not present

## 2018-09-21 DIAGNOSIS — R5081 Fever presenting with conditions classified elsewhere: Secondary | ICD-10-CM

## 2018-09-21 DIAGNOSIS — I700 Atherosclerosis of aorta: Secondary | ICD-10-CM | POA: Diagnosis not present

## 2018-09-21 DIAGNOSIS — J9601 Acute respiratory failure with hypoxia: Secondary | ICD-10-CM | POA: Diagnosis not present

## 2018-09-21 DIAGNOSIS — N390 Urinary tract infection, site not specified: Secondary | ICD-10-CM | POA: Diagnosis present

## 2018-09-21 DIAGNOSIS — R6520 Severe sepsis without septic shock: Secondary | ICD-10-CM | POA: Diagnosis not present

## 2018-09-21 DIAGNOSIS — D72810 Lymphocytopenia: Secondary | ICD-10-CM | POA: Diagnosis present

## 2018-09-21 DIAGNOSIS — Z800 Family history of malignant neoplasm of digestive organs: Secondary | ICD-10-CM

## 2018-09-21 DIAGNOSIS — R059 Cough, unspecified: Secondary | ICD-10-CM

## 2018-09-21 DIAGNOSIS — Z87891 Personal history of nicotine dependence: Secondary | ICD-10-CM

## 2018-09-21 LAB — COMPREHENSIVE METABOLIC PANEL
ALT: 12 U/L (ref 0–44)
AST: 16 U/L (ref 15–41)
Albumin: 3.7 g/dL (ref 3.5–5.0)
Alkaline Phosphatase: 65 U/L (ref 38–126)
Anion gap: 13 (ref 5–15)
BUN: 19 mg/dL (ref 8–23)
CO2: 22 mmol/L (ref 22–32)
Calcium: 9.7 mg/dL (ref 8.9–10.3)
Chloride: 101 mmol/L (ref 98–111)
Creatinine, Ser: 1.02 mg/dL (ref 0.61–1.24)
GFR calc Af Amer: >60 mL/min (ref >60)
GFR calc non Af Amer: >60 mL/min (ref >60)
Glucose, Bld: 180 mg/dL — ABNORMAL HIGH (ref 70–99)
Potassium: 4.1 mmol/L (ref 3.5–5.1)
Sodium: 136 mmol/L (ref 135–145)
Total Bilirubin: 1.2 mg/dL (ref 0.3–1.2)
Total Protein: 7.3 g/dL (ref 6.5–8.1)

## 2018-09-21 LAB — RESPIRATORY PANEL BY PCR

## 2018-09-21 LAB — CBC
HCT: 31.6 % — ABNORMAL LOW (ref 39.0–52.0)
Hemoglobin: 10.7 g/dL — ABNORMAL LOW (ref 13.0–17.0)
MCH: 31.3 pg (ref 26.0–34.0)
MCHC: 33.9 g/dL (ref 30.0–36.0)
MCV: 92.4 fL (ref 80.0–100.0)
Platelets: 125 10*3/uL — ABNORMAL LOW (ref 150–400)
RBC: 3.42 MIL/uL — ABNORMAL LOW (ref 4.22–5.81)
RDW: 11.3 % — ABNORMAL LOW (ref 11.5–15.5)
WBC: 3.4 10*3/uL — ABNORMAL LOW (ref 4.0–10.5)
nRBC: 0 % (ref 0.0–0.2)

## 2018-09-21 LAB — URINALYSIS, ROUTINE W REFLEX MICROSCOPIC
Bilirubin Urine: NEGATIVE
Glucose, UA: NEGATIVE mg/dL
Hgb urine dipstick: NEGATIVE
Ketones, ur: NEGATIVE mg/dL
Nitrite: NEGATIVE
Protein, ur: NEGATIVE mg/dL
Specific Gravity, Urine: 1.01 (ref 1.005–1.030)
pH: 6 (ref 5.0–8.0)

## 2018-09-21 LAB — CBC WITH DIFFERENTIAL/PLATELET
Abs Immature Granulocytes: 0 10*3/uL (ref 0.00–0.07)
Basophils Absolute: 0 10*3/uL (ref 0.0–0.1)
Basophils Relative: 0 %
Eosinophils Absolute: 0 10*3/uL (ref 0.0–0.5)
Eosinophils Relative: 2 %
HCT: 34.4 % — ABNORMAL LOW (ref 39.0–52.0)
Hemoglobin: 11.3 g/dL — ABNORMAL LOW (ref 13.0–17.0)
Immature Granulocytes: 0 %
Lymphocytes Relative: 36 %
Lymphs Abs: 0.4 10*3/uL — ABNORMAL LOW (ref 0.7–4.0)
MCH: 30.3 pg (ref 26.0–34.0)
MCHC: 32.8 g/dL (ref 30.0–36.0)
MCV: 92.2 fL (ref 80.0–100.0)
Monocytes Absolute: 0 10*3/uL — ABNORMAL LOW (ref 0.1–1.0)
Monocytes Relative: 1 %
Neutro Abs: 0.7 10*3/uL — ABNORMAL LOW (ref 1.7–7.7)
Neutrophils Relative %: 61 %
Platelets: 164 10*3/uL (ref 150–400)
RBC: 3.73 MIL/uL — ABNORMAL LOW (ref 4.22–5.81)
RDW: 11.2 % — ABNORMAL LOW (ref 11.5–15.5)
Smear Review: ADEQUATE
WBC: 1.1 10*3/uL — CL (ref 4.0–10.5)
nRBC: 0 % (ref 0.0–0.2)

## 2018-09-21 LAB — LACTIC ACID, PLASMA
Lactic Acid, Venous: 2.2 mmol/L (ref 0.5–1.9)
Lactic Acid, Venous: 1.6 mmol/L (ref 0.5–1.9)

## 2018-09-21 LAB — CREATININE, SERUM
Creatinine, Ser: 1.2 mg/dL (ref 0.61–1.24)
GFR calc Af Amer: >60 mL/min (ref >60)
GFR calc non Af Amer: 55 mL/min — ABNORMAL LOW (ref >60)

## 2018-09-21 LAB — GLUCOSE, CAPILLARY
Glucose-Capillary: 105 mg/dL — ABNORMAL HIGH (ref 70–99)
Glucose-Capillary: 130 mg/dL — ABNORMAL HIGH (ref 70–99)
Glucose-Capillary: 110 mg/dL — ABNORMAL HIGH (ref 70–99)

## 2018-09-21 LAB — BLOOD CULTURE ID PANEL (REFLEXED)

## 2018-09-21 LAB — C-REACTIVE PROTEIN: CRP: 11.2 mg/dL — ABNORMAL HIGH (ref <1.0)

## 2018-09-21 LAB — SEDIMENTATION RATE: Sed Rate: 53 mm/hr — ABNORMAL HIGH (ref 0–16)

## 2018-09-21 LAB — LACTATE DEHYDROGENASE: LDH: 126 U/L (ref 98–192)

## 2018-09-21 LAB — PROCALCITONIN: Procalcitonin: 17.07 ng/mL

## 2018-09-21 LAB — PROTIME-INR
INR: 1 (ref 0.8–1.2)
Prothrombin Time: 13.5 seconds (ref 11.4–15.2)

## 2018-09-21 LAB — INFLUENZA PANEL BY PCR (TYPE A & B)
Influenza A By PCR: NEGATIVE
Influenza B By PCR: NEGATIVE

## 2018-09-21 LAB — FERRITIN: Ferritin: 118 ng/mL (ref 24–336)

## 2018-09-21 LAB — PATHOLOGIST SMEAR REVIEW

## 2018-09-21 LAB — D-DIMER, QUANTITATIVE: D-Dimer, Quant: 3.88 ug/mL-FEU — ABNORMAL HIGH (ref 0.00–0.50)

## 2018-09-21 MED ORDER — ATORVASTATIN CALCIUM 10 MG PO TABS
10.0000 mg | ORAL_TABLET | Freq: Every day | ORAL | Status: DC
  Administered 2018-09-21 – 2018-09-25 (×5): 10 mg via ORAL
  Filled 2018-09-21 (×5): qty 1

## 2018-09-21 MED ORDER — ENOXAPARIN SODIUM 40 MG/0.4ML ~~LOC~~ SOLN
40.0000 mg | SUBCUTANEOUS | Status: DC
  Administered 2018-09-21 – 2018-09-22 (×2): 40 mg via SUBCUTANEOUS
  Filled 2018-09-21 (×2): qty 0.4

## 2018-09-21 MED ORDER — SODIUM CHLORIDE 0.9 % IV BOLUS: 1000.0000 mL | Freq: Once | INTRAVENOUS | Status: DC

## 2018-09-21 MED ORDER — ONDANSETRON HCL 4 MG PO TABS: 4.0000 mg | ORAL_TABLET | Freq: Four times a day (QID) | ORAL | Status: DC | PRN

## 2018-09-21 MED ORDER — SODIUM CHLORIDE 0.9 % IV SOLN
2.0000 g | Freq: Every day | INTRAVENOUS | Status: DC
  Filled 2018-09-21: qty 20

## 2018-09-21 MED ORDER — ASPIRIN 81 MG PO CHEW
81.0000 mg | CHEWABLE_TABLET | Freq: Every day | ORAL | Status: DC
  Administered 2018-09-21 – 2018-09-25 (×5): 81 mg via ORAL
  Filled 2018-09-21 (×5): qty 1

## 2018-09-21 MED ORDER — SODIUM CHLORIDE 0.9 % IV SOLN
INTRAVENOUS | Status: AC
  Administered 2018-09-21: 12:00:00 via INTRAVENOUS

## 2018-09-21 MED ORDER — SODIUM CHLORIDE 0.9 % IV SOLN
2.0000 g | INTRAVENOUS | Status: DC
  Administered 2018-09-21: 2 g via INTRAVENOUS
  Filled 2018-09-21 (×2): qty 20

## 2018-09-21 MED ORDER — ONDANSETRON HCL 4 MG/2ML IJ SOLN: 4.0000 mg | Freq: Four times a day (QID) | INTRAMUSCULAR | Status: DC | PRN

## 2018-09-21 MED ORDER — ACETAMINOPHEN 325 MG PO TABS
650.0000 mg | ORAL_TABLET | Freq: Four times a day (QID) | ORAL | Status: DC | PRN
  Administered 2018-09-21 – 2018-09-23 (×2): 650 mg via ORAL
  Filled 2018-09-21: qty 2

## 2018-09-21 MED ORDER — SODIUM CHLORIDE 0.9 % IV BOLUS
500.0000 mL | Freq: Once | INTRAVENOUS | Status: AC
  Administered 2018-09-21: 500 mL via INTRAVENOUS

## 2018-09-21 MED ORDER — ACETAMINOPHEN 80 MG RE SUPP
80.0000 mg | Freq: Once | RECTAL | Status: AC
  Administered 2018-09-21: 80 mg via RECTAL
  Filled 2018-09-21: qty 1

## 2018-09-21 MED ORDER — SODIUM CHLORIDE 0.9 % IV SOLN
500.0000 mg | INTRAVENOUS | Status: DC
  Administered 2018-09-21: 500 mg via INTRAVENOUS
  Filled 2018-09-21 (×2): qty 500

## 2018-09-21 MED ORDER — SODIUM CHLORIDE 0.9% FLUSH
3.0000 mL | Freq: Once | INTRAVENOUS | Status: AC
  Administered 2018-09-21: 3 mL via INTRAVENOUS

## 2018-09-21 MED ORDER — SODIUM CHLORIDE 0.9 % IV SOLN
2.0000 g | Freq: Every day | INTRAVENOUS | Status: DC
  Administered 2018-09-21 – 2018-09-24 (×4): 2 g via INTRAVENOUS
  Filled 2018-09-21 (×7): qty 20

## 2018-09-21 MED ORDER — ACETAMINOPHEN 500 MG PO TABS
1000.0000 mg | ORAL_TABLET | Freq: Once | ORAL | Status: AC
  Administered 2018-09-21: 1000 mg via ORAL
  Filled 2018-09-21: qty 2

## 2018-09-21 MED ORDER — INSULIN ASPART 100 UNIT/ML ~~LOC~~ SOLN
0.0000 [IU] | Freq: Three times a day (TID) | SUBCUTANEOUS | Status: DC
  Administered 2018-09-21 – 2018-09-23 (×2): 1 [IU] via SUBCUTANEOUS
  Administered 2018-09-23 – 2018-09-24 (×2): 2 [IU] via SUBCUTANEOUS
  Administered 2018-09-24: 1 [IU] via SUBCUTANEOUS
  Administered 2018-09-24: 2 [IU] via SUBCUTANEOUS
  Administered 2018-09-25: 1 [IU] via SUBCUTANEOUS

## 2018-09-21 MED ORDER — DILTIAZEM HCL 25 MG/5ML IV SOLN
15.0000 mg | INTRAVENOUS | Status: DC
  Filled 2018-09-21: qty 5

## 2018-09-21 NOTE — ED Notes (Signed)
ED Provider at bedside. 

## 2018-09-21 NOTE — ED Notes (Signed)
ED TO INPATIENT HANDOFF REPORT  ED Nurse Name and Phone #: Talmage Nap  S Name/Age/Gender Martin Bates 85 y.o. male Room/Bed: 023C/023C  Code Status   Code Status: Full Code  Home/SNF/Other Home Patient oriented to: self, place, time and situation Is this baseline? Yes   Triage Complete: Triage complete  Chief Complaint SOB, cough, fever  Triage Note Pt reports cough for 2 days with fever at home. Given tylenol by wife at home, unknown time. Denies SOB, travel, or contact with sick individual. Hx DM.    Allergies No Known Allergies  Level of Care/Admitting Diagnosis ED Disposition    ED Disposition Condition Comment   Admit  Hospital Area: [facility] [[id number]]  Level of Care: Med-Surg [16]  I expect the patient will be discharged within 24 hours: No (not a candidate for 5C-Observation unit)  Diagnosis: Suspected Covid-19 Virus Infection [[id number]]  Admitting Physician: Dessa Phi [[id number]]  Attending Physician: Dessa Phi [phone]  PT Class (Do Not Modify): Observation [104]  PT Acc Code (Do Not Modify): Observation [10022]       B Medical/Surgery History Past Medical History:  Diagnosis Date  . Anemia    Normocytic  . Cataracts, bilateral   . COPD (chronic obstructive pulmonary disease) ([place])   . Hyperlipidemia   . Internal hemorrhoids   . Prostate cancer ([place])   . Pulmonary emphysema ([place])    patient denies  . Tubulovillous adenoma of colon 2005  . Uncontrolled diabetes mellitus with microalbuminuria or microproteinuria    Past Surgical History:  Procedure Laterality Date  . COLONOSCOPY W/ POLYPECTOMY  03/30/2017  . CRYOABLATION N/A 10/24/2017   Procedure: CRYO ABLATION PROSTATE;  Surgeon: Lucas Mallow, MD;  Location: [facility];  Service: Urology;  Laterality: N/A;  . PROSTATECTOMY  2008     A IV Location/Drains/Wounds Patient Lines/Drains/Airways Status   Active Line/Drains/Airways    Name:    Placement date:   Placement time:   Site:   Days:   Peripheral IV 09/21/18 Left Forearm   09/21/18    0313    Forearm   less than 1   Peripheral IV 09/21/18 Right Forearm   09/21/18    0313    Forearm   less than 1   Urethral Catheter dr bell Latex;Straight-tip 16 Fr.   10/24/17    1240    Latex;Straight-tip   332   Incision (Closed) 10/24/17 Perineum Other (Comment)   10/24/17    1100     332          Intake/Output Last 24 hours  Intake/Output Summary (Last 24 hours) at 09/21/2018 0919 Last data filed at 09/21/2018 0706 Gross per 24 hour  Intake 849.33 ml  Output -  Net 849.33 ml    Labs/Imaging Results for orders placed or performed during the hospital encounter of 09/21/18 (from the past 48 hour(s))  Comprehensive metabolic panel     Status: Abnormal   Collection Time: 09/21/18  3:23 AM  Result Value Ref Range   Sodium 136 135 - 145 mmol/L   Potassium 4.1 3.5 - 5.1 mmol/L   Chloride 101 98 - 111 mmol/L   CO2 22 22 - 32 mmol/L   Glucose, Bld 180 (H) 70 - 99 mg/dL   BUN 19 8 - 23 mg/dL   Creatinine, Ser 1.02 0.61 - 1.24 mg/dL   Calcium 9.7 8.9 - 10.3 mg/dL   Total Protein 7.3 6.5 - 8.1 g/dL  Albumin 3.7 3.5 - 5.0 g/dL   AST 16 15 - 41 U/L   ALT 12 0 - 44 U/L   Alkaline Phosphatase 65 38 - 126 U/L   Total Bilirubin 1.2 0.3 - 1.2 mg/dL   GFR calc non Af Amer >60 >60 mL/min   GFR calc Af Amer >60 >60 mL/min   Anion gap 13 5 - 15    Comment: Performed at [facility] [address] [place], [place] [address]  Lactic acid, plasma     Status: Abnormal   Collection Time: 09/21/18  3:23 AM  Result Value Ref Range   Lactic Acid, Venous 2.2 (HH) 0.5 - 1.9 mmol/L    Comment: CRITICAL RESULT CALLED TO, READ BACK BY AND VERIFIED WITH: ISOM R,RN 09/21/18 0417 WAYK Performed at [facility], [place] [address] [place], [place] [address]   CBC with Differential     Status: Abnormal   Collection Time: 09/21/18  3:23 AM  Result Value Ref Range   WBC 1.1 (LL) 4.0 - 10.5  K/uL    Comment: This critical result has verified and been called to B.BAILIFF,RN by Tretha Sciara on 04 09 2020 at 0440, and has been read back.  REPEATED TO VERIFY WHITE COUNT CONFIRMED ON SMEAR CORRECTED ON 04/09 AT 0500: PREVIOUSLY REPORTED AS 1.1 This critical result has verified and been called to B.BAILIFF,RN by Tretha Sciara on 04 09 2020 at 0440, and has been read back.     RBC 3.73 (L) 4.22 - 5.81 MIL/uL   Hemoglobin 11.3 (L) 13.0 - 17.0 g/dL   HCT 34.4 (L) 39.0 - 52.0 %   MCV 92.2 80.0 - 100.0 fL   MCH 30.3 26.0 - 34.0 pg   MCHC 32.8 30.0 - 36.0 g/dL   RDW 11.2 (L) 11.5 - 15.5 %   Platelets 164 150 - 400 K/uL   nRBC 0.0 0.0 - 0.2 %   Neutrophils Relative % 61 %   Neutro Abs 0.7 (L) 1.7 - 7.7 K/uL   Lymphocytes Relative 36 %   Lymphs Abs 0.4 (L) 0.7 - 4.0 K/uL   Monocytes Relative 1 %   Monocytes Absolute 0.0 (L) 0.1 - 1.0 K/uL   Eosinophils Relative 2 %   Eosinophils Absolute 0.0 0.0 - 0.5 K/uL   Basophils Relative 0 %   Basophils Absolute 0.0 0.0 - 0.1 K/uL   Smear Review PLATELETS APPEAR ADEQUATE    Immature Granulocytes 0 %   Abs Immature Granulocytes 0.00 0.00 - 0.07 K/uL    Comment: Performed at [facility], [address], [place], [place] [address]  Protime-INR     Status: None   Collection Time: 09/21/18  3:23 AM  Result Value Ref Range   Prothrombin Time 13.5 11.4 - 15.2 seconds   INR 1.0 0.8 - 1.2    Comment: (NOTE) INR goal varies based on device and disease states. Performed at [facility], [place] [address] [place], [place] [address]   Influenza panel by PCR (type A & B)     Status: None   Collection Time: 09/21/18  3:35 AM  Result Value Ref Range   Influenza A By PCR NEGATIVE NEGATIVE   Influenza B By PCR NEGATIVE NEGATIVE    Comment: (NOTE) The Xpert Xpress Flu assay is intended as an aid in the diagnosis of  influenza and should not be used as a sole basis for treatment.  This  assay is FDA approved  for nasopharyngeal swab  specimens only. Nasal  washings and aspirates are unacceptable for Xpert Xpress Flu testing. Performed at [facility], [place] [address] [place], [place] [address]   Respiratory Panel by PCR     Status: None   Collection Time: 09/21/18  3:35 AM  Result Value Ref Range   Adenovirus NOT DETECTED NOT DETECTED   Coronavirus 229E NOT DETECTED NOT DETECTED    Comment: (NOTE) The Coronavirus on the Respiratory Panel, DOES NOT test for the novel  Coronavirus (2019 nCoV)    Coronavirus HKU1 NOT DETECTED NOT DETECTED   Coronavirus NL63 NOT DETECTED NOT DETECTED   Coronavirus OC43 NOT DETECTED NOT DETECTED   Metapneumovirus NOT DETECTED NOT DETECTED   Rhinovirus / Enterovirus NOT DETECTED NOT DETECTED   Influenza A NOT DETECTED NOT DETECTED   Influenza B NOT DETECTED NOT DETECTED   Parainfluenza Virus 1 NOT DETECTED NOT DETECTED   Parainfluenza Virus 2 NOT DETECTED NOT DETECTED   Parainfluenza Virus 3 NOT DETECTED NOT DETECTED   Parainfluenza Virus 4 NOT DETECTED NOT DETECTED   Respiratory Syncytial Virus NOT DETECTED NOT DETECTED   Bordetella pertussis NOT DETECTED NOT DETECTED   Chlamydophila pneumoniae NOT DETECTED NOT DETECTED   Mycoplasma pneumoniae NOT DETECTED NOT DETECTED    Comment: Performed at [facility], [place] [address] [place], [place] [address]  Lactic acid, plasma     Status: None   Collection Time: 09/21/18  5:22 AM  Result Value Ref Range   Lactic Acid, Venous 1.6 0.5 - 1.9 mmol/L    Comment: Performed at [place] [address], [place], [place] [address]  Urinalysis, Routine w reflex microscopic     Status: Abnormal   Collection Time: 09/21/18  5:51 AM  Result Value Ref Range   Color, Urine YELLOW YELLOW   APPearance CLEAR CLEAR   Specific Gravity, Urine 1.010 1.005 - 1.030   pH 6.0 5.0 - 8.0   Glucose, UA NEGATIVE NEGATIVE mg/dL   Hgb urine dipstick NEGATIVE NEGATIVE   Bilirubin Urine NEGATIVE NEGATIVE   Ketones, ur NEGATIVE NEGATIVE mg/dL    Protein, ur NEGATIVE NEGATIVE mg/dL   Nitrite NEGATIVE NEGATIVE   Leukocytes,Ua SMALL (A) NEGATIVE   RBC / HPF 0-5 0 - 5 RBC/hpf   WBC, UA 21-50 0 - 5 WBC/hpf   Bacteria, UA MANY (A) NONE SEEN   Squamous Epithelial / LPF 0-5 0 - 5   WBC Clumps PRESENT    Mucus PRESENT     Comment: Performed at [facility], [place] [address], [place], [place] [address]   Dg Chest Portable 1 View  Result Date: 09/21/2018 CLINICAL DATA:  Initial evaluation for acute cough, shortness of breath. EXAM: PORTABLE CHEST 1 VIEW COMPARISON:  Prior radiograph from 07/10/2004. FINDINGS: The cardiac and mediastinal silhouettes are stable in size and contour, and remain within normal limits. Aortic atherosclerosis. The lungs are normally inflated. Mild left basilar subsegmental atelectasis. No airspace consolidation, pleural effusion, or pulmonary edema is identified. There is no pneumothorax. No acute osseous abnormality identified. IMPRESSION: 1. Mild left basilar subsegmental atelectasis. No other active cardiopulmonary disease. 2. Aortic atherosclerosis. Electronically Signed   By: Jeannine Boga M.D.   On: 09/21/2018 03:56    Pending Labs Unresulted Labs (From admission, onward)    Start     Ordered   09/21/18 0633  Urine culture  ONCE - STAT,   STAT     09/21/18 2979   09/21/18  0612  Novel Coronavirus, NAA (hospital order; send-out to ref lab)  (Novel Coronavirus, NAA [facility] Order; send-out to ref lab) with precautions panel)  Once,   R    Question Answer Comment  Current symptoms Fever and Cough   Excluded other viral illnesses Yes   Exposure Risk None   Patient immune status Immunocompromised      09/21/18 0611   09/21/18 0323  Pathologist smear review  Once,   AD     09/21/18 0323   09/21/18 0319  Culture, blood (Routine x 2)  BLOOD CULTURE X 2,   STAT     09/21/18 0318   Signed and Held  CBC  (enoxaparin (LOVENOX)    CrCl >/= 30 ml/min)  Once,   R    Comments:  Baseline for enoxaparin  therapy IF NOT ALREADY DRAWN.  Notify MD if PLT < 100 K.    Signed and Held   Signed and Held  Creatinine, serum  (enoxaparin (LOVENOX)    CrCl >/= 30 ml/min)  Once,   R    Comments:  Baseline for enoxaparin therapy IF NOT ALREADY DRAWN.    Signed and Held   Signed and Held  Creatinine, serum  (enoxaparin (LOVENOX)    CrCl >/= 30 ml/min)  Weekly,   R    Comments:  while on enoxaparin therapy    Signed and Held   Signed and Held  Procalcitonin  Add-on,   R     Signed and Held   Signed and Held  Ferritin  Add-on,   R     Signed and Held   Signed and Held  D-dimer, quantitative (not at [facility])  Add-on,   R     Signed and Held   Signed and Held  Sedimentation rate  Add-on,   R     Signed and Held   Signed and Held  C-reactive protein  Add-on,   R     Signed and Held   Signed and Held  Lactate dehydrogenase  Add-on,   R     Signed and Held          Vitals/Pain Today's Vitals   09/21/18 0822 09/21/18 0822 09/21/18 0830 09/21/18 0900  BP:  102/61 106/62 98/62  Pulse:   89 84  Resp:   12 11  Temp:      TempSrc:      SpO2:   96% 100%  Weight:      Height:      PainSc: 0-No pain       Isolation Precautions Droplet and Contact precautions  Medications Medications  cefTRIAXone (ROCEPHIN) 2 g in sodium chloride 0.9 % 100 mL IVPB (0 g Intravenous Stopped 09/21/18 0706)  azithromycin (ZITHROMAX) 500 mg in sodium chloride 0.9 % 250 mL IVPB (0 mg Intravenous Stopped 09/21/18 0706)  sodium chloride flush (NS) 0.9 % injection 3 mL (3 mLs Intravenous Given 09/21/18 0313)  sodium chloride 0.9 % bolus 500 mL (0 mLs Intravenous Stopped 09/21/18 0410)  acetaminophen (TYLENOL) tablet 1,000 mg (1,000 mg Oral Given 09/21/18 0419)  sodium chloride 0.9 % bolus 500 mL (500 mLs Intravenous New Bag/Given 09/21/18 0746)    Mobility walks Low fall risk   Focused Assessments Pulmonary Assessment Handoff:  Lung sounds: L Breath Sounds: Diminished, Clear R Breath Sounds: Clear O2 Device: Room Air         R Recommendations: See Admitting Provider Note  Report given to:   Additional Notes:

## 2018-09-21 NOTE — Discharge Instructions (Addendum)
Person Under Monitoring Name: Martin Bates  Location: [place] [id number]   Infection Prevention Recommendations for Individuals Confirmed to have, or Being Evaluated for, 2019 Novel Coronavirus (COVID-19) Infection Who Receive Care at Home  Individuals who are confirmed to have, or are being evaluated for, COVID-19 should follow the prevention steps below until a healthcare provider or local or state health department says they can return to normal activities.  Stay home except to get medical care You should restrict activities outside your home, except for getting medical care. Do not go to work, school, or public areas, and do not use public transportation or taxis.  Call ahead before visiting your doctor Before your medical appointment, call the healthcare provider and tell them that you have, or are being evaluated for, COVID-19 infection. This will help the healthcare providers office take steps to keep other people from getting infected. Ask your healthcare provider to call the local or state health department.  Monitor your symptoms Seek prompt medical attention if your illness is worsening (e.g., difficulty breathing). Before going to your medical appointment, call the healthcare provider and tell them that you have, or are being evaluated for, COVID-19 infection. Ask your healthcare provider to call the local or state health department.  Wear a facemask You should wear a facemask that covers your nose and mouth when you are in the same room with other people and when you visit a healthcare provider. People who live with or visit you should also wear a facemask while they are in the same room with you.  Separate yourself from other people in your home As much as possible, you should stay in a different room from other people in your home. Also, you should use a separate bathroom, if available.  Avoid sharing household items You should not  share dishes, drinking glasses, cups, eating utensils, towels, bedding, or other items with other people in your home. After using these items, you should wash them thoroughly with soap and water.  Cover your coughs and sneezes Cover your mouth and nose with a tissue when you cough or sneeze, or you can cough or sneeze into your sleeve. Throw used tissues in a lined trash can, and immediately wash your hands with soap and water for at least 20 seconds or use an alcohol-based hand rub.  Wash your [facility] your hands often and thoroughly with soap and water for at least 20 seconds. You can use an alcohol-based hand sanitizer if soap and water are not available and if your hands are not visibly dirty. Avoid touching your eyes, nose, and mouth with unwashed hands.   Prevention Steps for Caregivers and Household Members of Individuals Confirmed to have, or Being Evaluated for, COVID-19 Infection Being Cared for in the Home  If you live with, or provide care at home for, a person confirmed to have, or being evaluated for, COVID-19 infection please follow these guidelines to prevent infection:  Follow healthcare providers instructions Make sure that you understand and can help the patient follow any healthcare provider instructions for all care.  Provide for the patients basic needs You should help the patient with basic needs in the home and provide support for getting groceries, prescriptions, and other personal needs.  Monitor the patients symptoms If they are getting sicker, call his or her medical provider and tell them that the patient has, or is being evaluated for, COVID-19 infection. This will help the healthcare  providers office take steps to keep other people from getting infected. Ask the healthcare provider to call the local or state health department.  Limit the number of people who have contact with the patient If possible, have only one caregiver for the  patient. Other household members should stay in another home or place of residence. If this is not possible, they should stay in another room, or be separated from the patient as much as possible. Use a separate bathroom, if available. Restrict visitors who do not have an essential need to be in the home.  Keep older adults, very young children, and other sick people away from the patient Keep older adults, very young children, and those who have compromised immune systems or chronic health conditions away from the patient. This includes people with chronic heart, lung, or kidney conditions, diabetes, and cancer.  Ensure good ventilation Make sure that shared spaces in the home have good air flow, such as from an air conditioner or an opened window, weather permitting.  Wash your hands often Wash your hands often and thoroughly with soap and water for at least 20 seconds. You can use an alcohol based hand sanitizer if soap and water are not available and if your hands are not visibly dirty. Avoid touching your eyes, nose, and mouth with unwashed hands. Use disposable paper towels to dry your hands. If not available, use dedicated cloth towels and replace them when they become wet.  Wear a facemask and gloves Wear a disposable facemask at all times in the room and gloves when you touch or have contact with the patients blood, body fluids, and/or secretions or excretions, such as sweat, saliva, sputum, nasal mucus, vomit, urine, or feces.  Ensure the mask fits over your nose and mouth tightly, and do not touch it during use. Throw out disposable facemasks and gloves after using them. Do not reuse. Wash your hands immediately after removing your facemask and gloves. If your personal clothing becomes contaminated, carefully remove clothing and launder. Wash your hands after handling contaminated clothing. Place all used disposable facemasks, gloves, and other waste in a lined container before  disposing them with other household waste. Remove gloves and wash your hands immediately after handling these items.  Do not share dishes, glasses, or other household items with the patient Avoid sharing household items. You should not share dishes, drinking glasses, cups, eating utensils, towels, bedding, or other items with a patient who is confirmed to have, or being evaluated for, COVID-19 infection. After the person uses these items, you should wash them thoroughly with soap and water.  Wash laundry thoroughly Immediately remove and wash clothes or bedding that have blood, body fluids, and/or secretions or excretions, such as sweat, saliva, sputum, nasal mucus, vomit, urine, or feces, on them. Wear gloves when handling laundry from the patient. Read and follow directions on labels of laundry or clothing items and detergent. In general, wash and dry with the warmest temperatures recommended on the label.  Clean all areas the individual has used often Clean all touchable surfaces, such as counters, tabletops, doorknobs, bathroom fixtures, toilets, phones, keyboards, tablets, and bedside tables, every day. Also, clean any surfaces that may have blood, body fluids, and/or secretions or excretions on them. Wear gloves when cleaning surfaces the patient has come in contact with. Use a diluted bleach solution (e.g., dilute bleach with 1 part bleach and 10 parts water) or a household disinfectant with a label that says EPA-registered for coronaviruses. To make  a bleach solution at home, add 1 tablespoon of bleach to 1 quart (4 cups) of water. For a larger supply, add  cup of bleach to 1 gallon (16 cups) of water. Read labels of cleaning products and follow recommendations provided on product labels. Labels contain instructions for safe and effective use of the cleaning product including precautions you should take when applying the product, such as wearing gloves or eye protection and making sure you  have good ventilation during use of the product. Remove gloves and wash hands immediately after cleaning.  Monitor yourself for signs and symptoms of illness Caregivers and household members are considered close contacts, should monitor their health, and will be asked to limit movement outside of the home to the extent possible. Follow the monitoring steps for close contacts listed on the symptom monitoring form.   ? If you have additional questions, contact your local health department or call the epidemiologist on call at [phone] (available 24/7). ? This guidance is subject to change. For the most up-to-date guidance from [facility], please refer to their website: YouBlogs.pl

## 2018-09-21 NOTE — Consult Note (Signed)
NAME:  Martin Bates, MRN:  [id number], DOB:  05-31-1931, LOS: 0 ADMISSION DATE:  09/21/2018, CONSULTATION DATE:  09/21/2018 REFERRING MD:  Dr. Maylene Roes, CHIEF COMPLAINT:  Respiratory Distress    History of present illness   85 year old male presents to ED on 4/9 with Fever, Chills, Dry Cough. CXR with mild left basilar subsegmental atelectasis. Patient reports that he has been isolating at home and has no known exposures. Lab work revealed WBC 1.1, Lymphocyte 0.4. Hypotensive with BP 90s. RVP/FLU negative. Admitted to 2W for COVID R/O. At [place] called as patient has increased oxygen requirement, now on 5L [place], and SVT with HR 148, Temp 103. Given Rectal Tylenol. Transferred by Dr. Maylene Roes to ICU with PCCM consult.    Patient arrived to ICU febrile on 6L [place] with oxygen saturation 100%. Patient throughout the day had been on room air, when bedside nurse reports that patient was walking in room and was complaining of chills, at the time patient was on room air and oxygen saturation was 83%, placed on 6L and called Rapid Response.   Past Medical History  COPD, DM  Significant Hospital Events   4/9 > Presents to ED   Consults:  PCCM 4/9 >>  Procedures:  N/A   Significant Diagnostic Tests:  CXR 4/9 > Mild left basilar subsegmental atelectasis. No other active cardiopulmonary disease. Aortic atherosclerosis.  Micro Data:  Blood 4/9 >> GNB >> Enterobacteriaceae species, E.Coli  Urine Culture 4/9 >>   Antimicrobials:  Rocephin 4/9 >>    Interim history/subjective:    Objective   Blood pressure (!) 105/51, pulse (!) 131, temperature (!) 103.2 F (39.6 C), temperature source Oral, resp. rate 13, height 5\' 4"  (1.626 m), weight 58.6 kg, SpO2 97 %.        Intake/Output Summary (Last 24 hours) at 09/21/2018 2130 Last data filed at 09/21/2018 1510 Gross per 24 hour  Intake 1107.44 ml  Output 300 ml  Net 807.44 ml   Filed Weights   09/21/18 0307 09/21/18 0930 09/21/18 2014  Weight:  56.7 kg 59.3 kg 58.6 kg    Examination: General: Elderly adult, sitting in bed, no distress  HENT: Dry MM  Lungs: Clear breath sounds, no wheeze/crackles  Cardiovascular: Tachy, no MRG  Abdomen: soft, non-distended, active bowel sounds  Extremities: -edema  Neuro: alert, oriented, follows commands  GU: condom cath in place   Resolved Hospital Problem list     Assessment & Plan:   R/O COVID-10  -Febrile, CXR with mild left basilar subsegmental atelectasis -RVP/Flu Negative  Plan  -COVID-19 pending   Urosepsis/Bacteremia  Plan  -Follow Culture Data -Trend WBC and Fever Curve  -Continue Rocephin  CAD, HLD Plan  -Continue ASA, Lipitor   DM Plan  -Trend glucose -SSI   Patient is currently in ICU and stable. Unless decompensation occurs overnight patient can maintain on Hospitalist Service.   Best practice:  Diet: Carb Mod DVT prophylaxis: Lovenox  Glucose control: SSI Mobility: Bedrest  Code Status: FC Family Communication: Wife updated on care plan   Labs   CBC: Recent Labs  Lab 09/21/18 0323 09/21/18 1131  WBC 1.1* 3.4*  NEUTROABS 0.7*  --   HGB 11.3* 10.7*  HCT 34.4* 31.6*  MCV 92.2 92.4  PLT 164 125*    Basic Metabolic Panel: Recent Labs  Lab 09/21/18 0323 09/21/18 1131  NA 136  --   K 4.1  --   CL 101  --   CO2 22  --  GLUCOSE 180*  --   BUN 19  --   CREATININE 1.02 1.20  CALCIUM 9.7  --    GFR: Estimated Creatinine Clearance: 37.3 mL/min (by C-G formula based on SCr of 1.2 mg/dL). Recent Labs  Lab 09/21/18 0323 09/21/18 0522 09/21/18 1131  PROCALCITON  --   --  17.07  WBC 1.1*  --  3.4*  LATICACIDVEN 2.2* 1.6  --     Liver Function Tests: Recent Labs  Lab 09/21/18 0323  AST 16  ALT 12  ALKPHOS 65  BILITOT 1.2  PROT 7.3  ALBUMIN 3.7   No results for input(s): LIPASE, AMYLASE in the last 168 hours. No results for input(s): AMMONIA in the last 168 hours.  ABG    Component Value Date/Time   TCO2 28 10/24/2017 0922      Coagulation Profile: Recent Labs  Lab 09/21/18 0323  INR 1.0    Cardiac Enzymes: No results for input(s): CKTOTAL, CKMB, CKMBINDEX, TROPONINI in the last 168 hours.  HbA1C: No results found for: HGBA1C  CBG: Recent Labs  Lab 09/21/18 1121 09/21/18 1605  GLUCAP 105* 130*    Review of Systems:   All negative; except for those that are bolded, which indicate positives.  Constitutional: weight loss, weight gain, night sweats, fevers, chills, fatigue, weakness.  HEENT: headaches, sore throat, sneezing, nasal congestion, post nasal drip, difficulty swallowing, tooth/dental problems, visual complaints, visual changes, ear aches. Neuro: difficulty with speech, weakness, numbness, ataxia. CV:  chest pain, orthopnea, PND, swelling in lower extremities, dizziness, palpitations, syncope.  Resp: cough, hemoptysis, dyspnea, wheezing. GI: heartburn, indigestion, abdominal pain, nausea, vomiting, diarrhea, constipation, change in bowel habits, loss of appetite, hematemesis, melena, hematochezia.  GU: dysuria, change in color of urine, urgency or frequency, flank pain, hematuria. MSK: joint pain or swelling, decreased range of motion. Psych: change in mood or affect, depression, anxiety, suicidal ideations, homicidal ideations. Skin: rash, itching, bruising.  Past Medical History  He,  has a past medical history of Anemia, Cataracts, bilateral, COPD (chronic obstructive pulmonary disease) ([place]), Hyperlipidemia, Internal hemorrhoids, Prostate cancer ([place]), Pulmonary emphysema ([place]), Tubulovillous adenoma of colon (2005), and Uncontrolled diabetes mellitus with microalbuminuria or microproteinuria.   Surgical History    Past Surgical History:  Procedure Laterality Date  . COLONOSCOPY W/ POLYPECTOMY  03/30/2017  . CRYOABLATION N/A 10/24/2017   Procedure: CRYO ABLATION PROSTATE;  Surgeon: Lucas Mallow, MD;  Location: [facility];  Service: Urology;  Laterality: N/A;   . PROSTATECTOMY  2008     Social History   reports that he has quit smoking. He has never used smokeless tobacco. He reports that he does not drink alcohol or use drugs.   Family History   His family history includes Colon cancer in his father; Stomach cancer in his mother. There is no history of Esophageal cancer.   Allergies No Known Allergies   Home Medications  Prior to Admission medications   Medication Sig Start Date End Date Taking? Authorizing Provider  acetaminophen (TYLENOL) 500 MG tablet Take 1,000 mg by mouth every 6 (six) hours as needed for mild pain.   Yes [provider]  Ascorbic Acid (VITAMIN C) 100 MG tablet Take 500 mg by mouth daily.    Yes [provider]  aspirin 81 MG tablet Take 81 mg by mouth daily.   Yes [provider]  atorvastatin (LIPITOR) 20 MG tablet Take 10 mg by mouth daily.    Yes [provider]  calcium citrate-vitamin  D (CITRACAL+D) 315-200 MG-UNIT tablet Take 1 tablet by mouth 2 (two) times daily.    Yes [provider]  metFORMIN (GLUCOPHAGE) 1000 MG tablet Take 500-1,000 mg by mouth 2 (two) times daily with a meal. 1000 mg in the morning and 500 mg in the evening   Yes [provider]  Multiple Vitamin (MULTIVITAMIN) capsule Take 1 capsule by mouth daily.   Yes [provider]  Omega-3 Fatty Acids (FISH OIL) 1000 MG CAPS Take 1 capsule by mouth every morning.    Yes [provider]     Hayden Pedro, AGACNP-BC [facility]  PCCM Pgr: [phone]

## 2018-09-21 NOTE — Significant Event (Signed)
Rapid Response Event Note  Overview:  RN called for SVT. RN reports pt was knocking on door to let them know his IV was beeping. Assisted back to bed, Dr. Maylene Roes and  RRT called.     Initial Focused Assessment: On arrival pt alert with tremors all over, skin dry and exteremly hot to touch, sats 78-84% on RA, RR 38, SBP 150, HR 148, axillary temp 103 Placed pt on NRB sats 100%, removed blankets, left one sheet on, 650 mg tylenol PR  Switched to 5L [place] sats 98%, HR 120, RR 24 DR. Choi to bedside, plan to consult with CCM   Interventions: EKG ST 650 mg Tylenol PR  Plan of Care (if not transferred): Consult with CCM Event Summary:   at      at          [facility], [place]

## 2018-09-21 NOTE — ED Triage Notes (Signed)
Pt reports cough for 2 days with fever at home. Given tylenol by wife at home, unknown time. Denies SOB, travel, or contact with sick individual. Hx DM.

## 2018-09-21 NOTE — H&P (Addendum)
History and Physical    [name] HQI:[id number] DOB: Mar 29, 1931 DOA: 09/21/2018  PCP: Deland Pretty, MD  Patient coming from: Home   Chief Complaint: Fever  HPI: Martin Bates is a 85 y.o. male with medical history significant of COPD, diabetes who presents with 2-day history of fever, chills and a dry cough.  He has been isolating at home with his wife, denies any known COVID-19 exposure.  He denies any shortness of breath, chest pain, nausea, vomiting, abdominal pain or diarrhea, dizziness or lightheadedness.  History gathered by myself using [place], through phone in patient's room.  ED Course: Lab obtained which revealed WBC 1.1, lymphocyte count 0.4.  Chest x-ray revealed mild left basilar subsegmental atelectasis without other active cardiopulmonary disease.  Influenza and respiratory panel negative.  He has not required any supplemental oxygen, however patient was referred for admission due to his low blood pressure in the 90s.  Patient remained asymptomatic as far as low blood pressure.  EDP felt patient was at high risk of decompensation due to his age and medical comorbidities including COPD and diabetes.  Review of Systems: As per HPI otherwise 10 point review of systems negative.   Past Medical History:  Diagnosis Date  . Anemia    Normocytic  . Cataracts, bilateral   . COPD (chronic obstructive pulmonary disease) (Martin Bates)   . Hyperlipidemia   . Internal hemorrhoids   . Prostate cancer (Martin Bates)   . Pulmonary emphysema (Martin Bates)    patient denies  . Tubulovillous adenoma of colon 2005  . Uncontrolled diabetes mellitus with microalbuminuria or microproteinuria     Past Surgical History:  Procedure Laterality Date  . COLONOSCOPY W/ POLYPECTOMY  03/30/2017  . CRYOABLATION N/A 10/24/2017   Procedure: CRYO ABLATION PROSTATE;  Surgeon: Lucas Mallow, MD;  Location: [facility];  Service: Urology;  Laterality: N/A;  . PROSTATECTOMY  2008     reports  that he has quit smoking. He has never used smokeless tobacco. He reports that he does not drink alcohol or use drugs.  No Known Allergies  Family History  Problem Relation Age of Onset  . Colon cancer Father   . Stomach cancer Mother   . Esophageal cancer Neg Hx     Prior to Admission medications   Medication Sig Start Date End Date Taking? Authorizing Provider  acetaminophen (TYLENOL) 500 MG tablet Take 1,000 mg by mouth every 6 (six) hours as needed for mild pain.   Yes [provider]  Ascorbic Acid (VITAMIN C) 100 MG tablet Take 500 mg by mouth daily.    Yes [provider]  aspirin 81 MG tablet Take 81 mg by mouth daily.   Yes [provider]  atorvastatin (LIPITOR) 20 MG tablet Take 10 mg by mouth daily.    Yes [provider]  calcium citrate-vitamin D (CITRACAL+D) 315-200 MG-UNIT tablet Take 1 tablet by mouth 2 (two) times daily.    Yes [provider]  metFORMIN (GLUCOPHAGE) 1000 MG tablet Take 500-1,000 mg by mouth 2 (two) times daily with a meal. 1000 mg in the morning and 500 mg in the evening   Yes [provider]  Multiple Vitamin (MULTIVITAMIN) capsule Take 1 capsule by mouth daily.   Yes [provider]  Omega-3 Fatty Acids (FISH OIL) 1000 MG CAPS Take 1 capsule by mouth every morning.    Yes [provider]  HYDROcodone-acetaminophen (NORCO) 5-325 MG tablet Take 1 tablet by mouth every  4 (four) hours as needed for moderate pain. Patient not taking: Reported on 09/21/2018 10/24/17   Lucas Mallow, MD    Physical Exam: Vitals:   09/21/18 0730 09/21/18 0800 09/21/18 0822 09/21/18 0830  BP: (!) 92/53 90/72 102/61 106/62  Pulse: 87 86  89  Resp: 17 16  12   Temp:      TempSrc:      SpO2: 95% 98%  96%  Weight:      Height:          Examination: General exam: Appears calm and comfortable  Respiratory system: Clear to auscultation. Respiratory effort normal. On room air, no conversational  dyspnea  Cardiovascular system: S1 & S2 heard, RRR.  No pedal edema. Gastrointestinal system: Abdomen is nondistended, soft and nontender. No organomegaly or masses felt. Normal bowel sounds heard. Central nervous system: Alert and oriented. No focal neurological deficits. Extremities: Symmetric 5 x 5 power. Skin: No rashes, lesions or ulcers Psychiatry: Judgement and insight appear normal. Mood & affect appropriate.   Labs on Admission: I have personally reviewed following labs and imaging studies  CBC: Recent Labs  Lab 09/21/18 0323  WBC 1.1*  NEUTROABS 0.7*  HGB 11.3*  HCT 34.4*  MCV 92.2  PLT 599   Basic Metabolic Panel: Recent Labs  Lab 09/21/18 0323  NA 136  K 4.1  CL 101  CO2 22  GLUCOSE 180*  BUN 19  CREATININE 1.02  CALCIUM 9.7   GFR: Estimated Creatinine Clearance: 39.2 mL/min (by C-G formula based on SCr of 1.02 mg/dL). Liver Function Tests: Recent Labs  Lab 09/21/18 0323  AST 16  ALT 12  ALKPHOS 65  BILITOT 1.2  PROT 7.3  ALBUMIN 3.7   No results for input(s): LIPASE, AMYLASE in the last 168 hours. No results for input(s): AMMONIA in the last 168 hours. Coagulation Profile: Recent Labs  Lab 09/21/18 0323  INR 1.0   Cardiac Enzymes: No results for input(s): CKTOTAL, CKMB, CKMBINDEX, TROPONINI in the last 168 hours. BNP (last 3 results) No results for input(s): PROBNP in the last 8760 hours. HbA1C: No results for input(s): HGBA1C in the last 72 hours. CBG: No results for input(s): GLUCAP in the last 168 hours. Lipid Profile: No results for input(s): CHOL, HDL, LDLCALC, TRIG, CHOLHDL, LDLDIRECT in the last 72 hours. Thyroid Function Tests: No results for input(s): TSH, T4TOTAL, FREET4, T3FREE, THYROIDAB in the last 72 hours. Anemia Panel: No results for input(s): VITAMINB12, FOLATE, FERRITIN, TIBC, IRON, RETICCTPCT in the last 72 hours. Urine analysis:    Component Value Date/Time   COLORURINE YELLOW 09/21/2018 [place] 09/21/2018 0551   LABSPEC 1.010 09/21/2018 0551   PHURINE 6.0 09/21/2018 0551   GLUCOSEU NEGATIVE 09/21/2018 0551   HGBUR NEGATIVE 09/21/2018 0551   BILIRUBINUR NEGATIVE 09/21/2018 0551   KETONESUR NEGATIVE 09/21/2018 0551   PROTEINUR NEGATIVE 09/21/2018 0551   NITRITE NEGATIVE 09/21/2018 0551   LEUKOCYTESUR SMALL (A) 09/21/2018 0551   Sepsis Labs: !!!!!!!!!!!!!!!!!!!!!!!!!!!!!!!!!!!!!!!!!!!! @LABRCNTIP (procalcitonin:4,lacticidven:4) ) Recent Results (from the past 240 hour(s))  Respiratory Panel by PCR     Status: None   Collection Time: 09/21/18  3:35 AM  Result Value Ref Range Status   Adenovirus NOT DETECTED NOT DETECTED Final   Coronavirus 229E NOT DETECTED NOT DETECTED Final    Comment: (NOTE) The Coronavirus on the Respiratory Panel, DOES NOT test for the novel  Coronavirus (2019 nCoV)    Coronavirus HKU1 NOT DETECTED NOT DETECTED Final   Coronavirus NL63  NOT DETECTED NOT DETECTED Final   Coronavirus OC43 NOT DETECTED NOT DETECTED Final   Metapneumovirus NOT DETECTED NOT DETECTED Final   Rhinovirus / Enterovirus NOT DETECTED NOT DETECTED Final   Influenza A NOT DETECTED NOT DETECTED Final   Influenza B NOT DETECTED NOT DETECTED Final   Parainfluenza Virus 1 NOT DETECTED NOT DETECTED Final   Parainfluenza Virus 2 NOT DETECTED NOT DETECTED Final   Parainfluenza Virus 3 NOT DETECTED NOT DETECTED Final   Parainfluenza Virus 4 NOT DETECTED NOT DETECTED Final   Respiratory Syncytial Virus NOT DETECTED NOT DETECTED Final   Bordetella pertussis NOT DETECTED NOT DETECTED Final   Chlamydophila pneumoniae NOT DETECTED NOT DETECTED Final   Mycoplasma pneumoniae NOT DETECTED NOT DETECTED Final    Comment: Performed at [facility], [place] [address], [place], [place] [address]     Radiological Exams on Admission: Dg Chest Portable 1 View  Result Date: 09/21/2018 CLINICAL DATA:  Initial evaluation for acute cough, shortness of breath. EXAM: PORTABLE CHEST 1 VIEW  COMPARISON:  Prior radiograph from 07/10/2004. FINDINGS: The cardiac and mediastinal silhouettes are stable in size and contour, and remain within normal limits. Aortic atherosclerosis. The lungs are normally inflated. Mild left basilar subsegmental atelectasis. No airspace consolidation, pleural effusion, or pulmonary edema is identified. There is no pneumothorax. No acute osseous abnormality identified. IMPRESSION: 1. Mild left basilar subsegmental atelectasis. No other active cardiopulmonary disease. 2. Aortic atherosclerosis. Electronically Signed   By: Jeannine Boga M.D.   On: 09/21/2018 03:56   Chest x-ray independently reviewed, no focal consolidation noted  EKG: Independently reviewed.  Sinus tachycardia, rate 113.  No ST change.  Normal R wave progression.  Assessment/Plan Principal Problem:   Suspected Covid-19 Virus Infection Active Problems:   DM type 2 (diabetes mellitus, type 2) (HCC)   Lactic acid acidosis   CAD (coronary artery disease)   Hyperlipidemia  Suspected COVID-19 -Patient presents with fever 102, lymphopenia, no acute cardiopulmonary disease on chest x-ray -Influenza and respiratory viral panel are negative -COVID-19 test pending  Lactic acidosis -Improved after IV fluid  CAD -Continue aspirin  Hyperlipidemia -Continue Lipitor  Type 2 diabetes -Sliding scale insulin, hold home metformin   DVT prophylaxis: Lovenox Code Status: Full code, discussed with patient at time of admission Family Communication: Patient declined for me to call his wife Disposition Plan: Pending improvement in his blood pressure Consults called: None Admission status: Observation  Severity of Illness: The appropriate patient status for this patient is OBSERVATION. Observation status is judged to be reasonable and necessary in order to provide the required intensity of service to ensure the patient's safety. The patient's presenting symptoms, physical exam findings, and  initial radiographic and laboratory data in the context of their medical condition is felt to place them at decreased risk for further clinical deterioration. Furthermore, it is anticipated that the patient will be medically stable for discharge from the hospital within 2 midnights of admission. The following factors support the patient status of observation.   " The patient's presenting symptoms include fever, cough. " The physical exam findings include low blood pressure with SBP in the 90s. " The initial radiographic and laboratory data are difficult for lymphopenia.    Dessa Phi, DO [facility] 09/21/2018, 8:58 AM    How to contact the [facility] Attending or Consulting provider [place] or covering provider during after hours [place], for this patient?  1. Check the care team in [facility] and look for a) attending/consulting  [place] provider listed and b) the [facility] team listed 2. Log into [email or web address] and use [place]'s universal password to access. If you do not have the password, please contact the hospital operator. 3. Locate the [facility] provider you are looking for under [facility] and page to a number that you can be directly reached. 4. If you still have difficulty reaching the provider, please page the [facility] (Director on Call) for the Hospitalists listed on amion for assistance.

## 2018-09-21 NOTE — ED Notes (Signed)
Attempted report 

## 2018-09-21 NOTE — ED Provider Notes (Addendum)
[facility] EMERGENCY DEPARTMENT Provider Note  CSN: [id number] Arrival date & time: 09/21/18 0251  Chief Complaint(s) Fever and Cough  HPI Martin Bates is a 85 y.o. male with a past medical history listed below who presents to the emergency department with 2 days of fever, chills and dry cough.  Patient denies any known sick contacts.  States that he has been isolating himself at home.  No known COVID-19 exposures.  Denies any shortness of breath or chest pain.  No nausea or vomiting.  No abdominal pain or diarrhea.  No urinary symptoms.  Denies any other physical complaint.  HPI  Past Medical History Past Medical History:  Diagnosis Date  . Anemia    Normocytic  . Cataracts, bilateral   . COPD (chronic obstructive pulmonary disease) ([place])   . Hyperlipidemia   . Internal hemorrhoids   . Prostate cancer ([place])   . Pulmonary emphysema ([place])    patient denies  . Tubulovillous adenoma of colon 2005  . Uncontrolled diabetes mellitus with microalbuminuria or microproteinuria    There are no active problems to display for this patient.  Home Medication(s) Prior to Admission medications   Medication Sig Start Date End Date Taking? Authorizing Provider  acetaminophen (TYLENOL) 500 MG tablet Take 1,000 mg by mouth every 6 (six) hours as needed for mild pain.   Yes [provider]  Ascorbic Acid (VITAMIN C) 100 MG tablet Take 500 mg by mouth daily.    Yes [provider]  aspirin 81 MG tablet Take 81 mg by mouth daily.   Yes [provider]  atorvastatin (LIPITOR) 20 MG tablet Take 10 mg by mouth daily.    Yes [provider]  calcium citrate-vitamin D (CITRACAL+D) 315-200 MG-UNIT tablet Take 1 tablet by mouth 2 (two) times daily.    Yes [provider]  metFORMIN (GLUCOPHAGE) 1000 MG tablet Take 500-1,000 mg by mouth 2 (two) times daily with a meal. 1000 mg in the morning and 500 mg in the evening   Yes [provider]   Multiple Vitamin (MULTIVITAMIN) capsule Take 1 capsule by mouth daily.   Yes [provider]  Omega-3 Fatty Acids (FISH OIL) 1000 MG CAPS Take 1 capsule by mouth every morning.    Yes [provider]  HYDROcodone-acetaminophen (NORCO) 5-325 MG tablet Take 1 tablet by mouth every 4 (four) hours as needed for moderate pain. Patient not taking: Reported on 09/21/2018 10/24/17   Lucas Mallow, MD                                                                                                                                    Past Surgical History Past Surgical History:  Procedure Laterality Date  . COLONOSCOPY W/ POLYPECTOMY  03/30/2017  . CRYOABLATION N/A 10/24/2017   Procedure: CRYO ABLATION PROSTATE;  Surgeon: Lucas Mallow, MD;  Location: [facility];  Service: Urology;  Laterality: N/A;  . PROSTATECTOMY  2008   Family History Family History  Problem Relation Age of Onset  . Colon cancer Father   . Stomach cancer Mother   . Esophageal cancer Neg Hx     Social History Social History   Tobacco Use  . Smoking status: Former Research scientist (life sciences)  . Smokeless tobacco: Never Used  Substance Use Topics  . Alcohol use: No  . Drug use: No   Allergies Patient has no known allergies.  Review of Systems Review of Systems All other systems are reviewed and are negative for acute change except as noted in the HPI  Physical Exam Vital Signs  I have reviewed the triage vital signs BP (!) 120/56   Pulse (!) 105   Temp (S) (!) 102 F (38.9 C) (Oral)   Resp 14   Ht 5' 1.02" (1.55 m)   Wt 56.7 kg   SpO2 93%   BMI 23.60 kg/m   Physical Exam Vitals signs reviewed.  Constitutional:      General: He is not in acute distress.    Appearance: He is well-developed. He is not diaphoretic.  HENT:     Head: Normocephalic and atraumatic.     Right Ear: Tympanic membrane normal.     Left Ear: Tympanic membrane normal.     Nose: Nose normal.     Mouth/Throat:      Pharynx: Oropharynx is clear.  Eyes:     General: No scleral icterus.       Right eye: No discharge.        Left eye: No discharge.     Conjunctiva/sclera: Conjunctivae normal.     Pupils: Pupils are equal, round, and reactive to light.  Neck:     Musculoskeletal: Normal range of motion and neck supple.  Cardiovascular:     Rate and Rhythm: Normal rate and regular rhythm.     Heart sounds: No murmur. No friction rub. No gallop.   Pulmonary:     Effort: Pulmonary effort is normal. No respiratory distress.     Breath sounds: Normal breath sounds. No stridor. No rales.  Abdominal:     General: There is no distension.     Palpations: Abdomen is soft.     Tenderness: There is no abdominal tenderness.  Musculoskeletal:        General: No tenderness.  Skin:    General: Skin is warm and dry.     Findings: No erythema or rash.  Neurological:     Mental Status: He is alert and oriented to person, place, and time.     ED Results and Treatments Labs (all labs ordered are listed, but only abnormal results are displayed) Labs Reviewed  COMPREHENSIVE METABOLIC PANEL - Abnormal; Notable for the following components:      Result Value   Glucose, Bld 180 (*)    All other components within normal limits  LACTIC ACID, PLASMA - Abnormal; Notable for the following components:   Lactic Acid, Venous 2.2 (*)    All other components within normal limits  CBC WITH DIFFERENTIAL/PLATELET - Abnormal; Notable for the following components:   WBC 1.1 (*)    RBC 3.73 (*)    Hemoglobin 11.3 (*)    HCT 34.4 (*)    RDW 11.2 (*)    Neutro Abs 0.7 (*)    Lymphs Abs 0.4 (*)    Monocytes Absolute 0.0 (*)    All other components within normal limits  URINALYSIS, ROUTINE W REFLEX MICROSCOPIC - Abnormal; Notable for the following components:   Leukocytes,Ua SMALL (*)    Bacteria, UA MANY (*)    All other components within normal limits  RESPIRATORY PANEL BY PCR  CULTURE, BLOOD (ROUTINE X 2)  CULTURE,  BLOOD (ROUTINE X 2)  NOVEL CORONAVIRUS, NAA (HOSPITAL ORDER, SEND-OUT TO REF LAB)  URINE CULTURE  LACTIC ACID, PLASMA  PROTIME-INR  INFLUENZA PANEL BY PCR (TYPE A & B)  PATHOLOGIST SMEAR REVIEW                                                                                                                         EKG  EKG Interpretation  Date/Time:  Thursday September 21 2018 03:11:38 EDT Ventricular Rate:  113 PR Interval:    QRS Duration: 82 QT Interval:  314 QTC Calculation: 431 R Axis:   71 Text Interpretation:  Sinus tachycardia Minimal ST depression, lateral leads NO STEMI. Confirmed by Addison Lank [phone]) on 09/21/2018 6:01:00 AM Also confirmed by Addison Lank [phone]), editor Philomena Doheny [phone])  on 09/21/2018 7:22:11 AM      Radiology Dg Chest Portable 1 View  Result Date: 09/21/2018 CLINICAL DATA:  Initial evaluation for acute cough, shortness of breath. EXAM: PORTABLE CHEST 1 VIEW COMPARISON:  Prior radiograph from 07/10/2004. FINDINGS: The cardiac and mediastinal silhouettes are stable in size and contour, and remain within normal limits. Aortic atherosclerosis. The lungs are normally inflated. Mild left basilar subsegmental atelectasis. No airspace consolidation, pleural effusion, or pulmonary edema is identified. There is no pneumothorax. No acute osseous abnormality identified. IMPRESSION: 1. Mild left basilar subsegmental atelectasis. No other active cardiopulmonary disease. 2. Aortic atherosclerosis. Electronically Signed   By: Jeannine Boga M.D.   On: 09/21/2018 03:56   Pertinent labs & imaging results that were available during my care of the patient were reviewed by me and considered in my medical decision making (see chart for details).  Medications Ordered in ED Medications  cefTRIAXone (ROCEPHIN) 2 g in sodium chloride 0.9 % 100 mL IVPB (0 g Intravenous Stopped 09/21/18 0706)  azithromycin (ZITHROMAX) 500 mg in sodium chloride 0.9 % 250 mL IVPB (0 mg  Intravenous Stopped 09/21/18 0706)  sodium chloride flush (NS) 0.9 % injection 3 mL (3 mLs Intravenous Given 09/21/18 0313)  sodium chloride 0.9 % bolus 500 mL (0 mLs Intravenous Stopped 09/21/18 0410)  acetaminophen (TYLENOL) tablet 1,000 mg (1,000 mg Oral Given 09/21/18 0419)  sodium chloride 0.9 % bolus 500 mL (500 mLs Intravenous New Bag/Given 09/21/18 0746)  Procedures Procedures CRITICAL CARE Performed by: Grayce Sessions Tasheika Kitzmiller Total critical care time: 45 minutes Critical care time was exclusive of separately billable procedures and treating other patients. Critical care was necessary to treat or prevent imminent or life-threatening deterioration. Critical care was time spent personally by me on the following activities: development of treatment plan with patient and/or surrogate as well as nursing, discussions with consultants, evaluation of patient's response to treatment, examination of patient, obtaining history from patient or surrogate, ordering and performing treatments and interventions, ordering and review of laboratory studies, ordering and review of radiographic studies, pulse oximetry and re-evaluation of patient's condition.   (including critical care time)  Medical Decision Making / ED Course I have reviewed the nursing notes for this encounter and the patient's prior records (if available in EHR or on provided paperwork).    Patient presents with fever and respiratory symptoms.  Lungs clear to auscultation bilaterally.  Chest x-ray without evidence of pneumonia.  Likely viral process.  Ruling out influenza and other respiratory viral infections.  Given uptake in the community for COVID-19, this is also a possibility.  Patient is otherwise well-appearing and well-hydrated.  He is nontoxic.  Satting well on room air without any respiratory distress.   Screening labs will be obtained.   Chest x-ray with left basilar opacity favored to be atelectasis.  Patient with elevated lactic acid at 2.2.  CBC with leukopenia.  Given respiratory symptoms, code sepsis was initiated and patient was treated empirically with Rocephin and Azithro.  Influenza was negative.  RVP and UA currently pending.  6:11 AM RVP negative.  6:31 AM UA with possible UTI, but feel respiratory source is likely cause of his fever.  Lactic acid cleared with initial IV fluids.  Patient was monitored and noted to have downtrending his blood pressure with systolics in the mid to low 90s.  Patient is asymptomatic and able to tolerate oral intake.  I discussed the case with Dr. Dessa Phi from the hospitalist service who requested that we provided another additional 500 IV fluid bolus and reassess.  If patient still not responding, patient will be admitted.   Blood pressure still soft after additional 500 cc bolus.  Dr. Maylene Roes will admit the patient to the hospitalist service for continued management.  Martin Bates was evaluated in Emergency Department on 09/21/2018 for the symptoms described in the history of present illness. He was evaluated in the context of the global COVID-19 pandemic, which necessitated consideration that the patient might be at risk for infection with the SARS-CoV-2 virus that causes COVID-19. Institutional protocols and algorithms that pertain to the evaluation of patients at risk for COVID-19 are in a state of rapid change based on information released by regulatory bodies including the CDC and federal and state organizations. These policies and algorithms were followed during the patient's care in the ED.       Final Clinical Impression(s) / ED Diagnoses Final diagnoses:  Fever in other diseases  Cough      This chart was dictated using voice recognition software.  Despite best efforts to proofread,  errors can occur which can change the  documentation meaning.     Fatima Blank, MD 09/21/18 [phone]

## 2018-09-21 NOTE — ED Notes (Signed)
Date and time results received: 09/21/18 4:18 AM  Test: Lactic Acid Critical Value: 2.2  Name of Provider Notified: [place]  Orders Received? Or Actions Taken?: EDP notified.

## 2018-09-21 NOTE — Progress Notes (Signed)
PHARMACY - PHYSICIAN COMMUNICATION CRITICAL VALUE ALERT - BLOOD CULTURE IDENTIFICATION (BCID)  Martin Bates is an 85 y.o. male who presented to [facility] on 09/21/2018 with a chief complaint of fever, chills and dry cough.   Assessment:  1/4 BCx grew out GNR's. BCID detected E. Coli with no KPC resistance. Of note, patient received a dose of ceftriaxone and azithromycin this AM.   Name of physician (or Provider) Contacted: Dr. Maylene Roes  Current antibiotics: None  Changes to prescribed antibiotics recommended: Ceftriaxone 2 gm IV Q24 hours.  Recommendations accepted by provider  Results for orders placed or performed during the hospital encounter of 09/21/18  Blood Culture ID Panel (Reflexed) (Collected: 09/21/2018  3:10 AM)  Result Value Ref Range   Enterococcus species NOT DETECTED NOT DETECTED   Listeria monocytogenes NOT DETECTED NOT DETECTED   Staphylococcus species NOT DETECTED NOT DETECTED   Staphylococcus aureus (BCID) NOT DETECTED NOT DETECTED   Streptococcus species NOT DETECTED NOT DETECTED   Streptococcus agalactiae NOT DETECTED NOT DETECTED   Streptococcus pneumoniae NOT DETECTED NOT DETECTED   Streptococcus pyogenes NOT DETECTED NOT DETECTED   Acinetobacter baumannii NOT DETECTED NOT DETECTED   Enterobacteriaceae species DETECTED (A) NOT DETECTED   Enterobacter cloacae complex NOT DETECTED NOT DETECTED   Escherichia coli DETECTED (A) NOT DETECTED   Klebsiella oxytoca NOT DETECTED NOT DETECTED   Klebsiella pneumoniae NOT DETECTED NOT DETECTED   Proteus species NOT DETECTED NOT DETECTED   Serratia marcescens NOT DETECTED NOT DETECTED   Carbapenem resistance NOT DETECTED NOT DETECTED   Haemophilus influenzae NOT DETECTED NOT DETECTED   Neisseria meningitidis NOT DETECTED NOT DETECTED   Pseudomonas aeruginosa NOT DETECTED NOT DETECTED   Candida albicans NOT DETECTED NOT DETECTED   Candida glabrata NOT DETECTED NOT DETECTED   Candida krusei NOT DETECTED NOT DETECTED   Candida parapsilosis NOT DETECTED NOT DETECTED   Candida tropicalis NOT DETECTED NOT DETECTED    Albertina Parr, PharmD., BCPS Clinical Pharmacist Clinical phone for 09/21/18 until 10:30pm: [phone] If after 10:30pm, please refer to [facility] for unit-specific pharmacist

## 2018-09-21 NOTE — Progress Notes (Signed)
Patient arrived to unit via stretcher from emergency department. Alert and oriented x 4. Afebrile. Ambulates independently. On room air O2 SAT 97-98%. Started on IV fluids (Normal saline). Call bell within reach. Encouraged to report signs and symptoms to staff.

## 2018-09-21 NOTE — Progress Notes (Signed)
Report given to Nurse [place]. Patient assigned to room 2MW-03.

## 2018-09-21 NOTE — Progress Notes (Signed)
Patient stated "I feel cold". Axillary temp 102 F. MD Maylene Roes notified. Order for tylenol suppository given. EKG completed as well. HR 140's (sinus tachycardia). O2 SAT 83% and patient placed on 15 liters non rebreather with O2 SAT 100%.

## 2018-09-21 NOTE — Progress Notes (Addendum)
   Called by RN that patient was in SVT, rate 160s. BP stable. Patient symptom free. Ordered IV cardizem 15mg  once. EKG ordered.  Then notified patient febrile 103F and 83% on room air. He required nonrebreather mask in order to increase his SpO2 to 100%. Discussed with PCCM over the phone; after my evaluation, if I believe he needs intubation, I will transfer him to ICU.   Patient evaluated at bedside. He is [place] speaking, but appears confused. I spoke with him in [place]; his mentation has changed. He was alert and oriented on my examination this morning, speech clear. Currently, he says that he is at home, and that he has the virus. He mumbles incoherently and does not answer other questions appropriately.   His HR 120s on exam now, down to 5L but tachypneic. He is at high risk of decompensation. He was on room air all day prior to this event.   I called and spoke with his wife over the phone. We spoke in [place] as her English is not great. She was surprised to hear of his decompensation; she had just talked to patient around 4pm and he sounded great. She was in agreement with full code status and transfer to ICU. I also called and spoke with daughter-in-law over the phone. I answered her questions and gave an update on his status to transfer to ICU.   Also note blood culture from this morning resulted with GNR, unclear source. He had no urinary or abdominal complaints on admission or currently. Will defer additional imaging for now due to COVID rule out. Empiric Rocephin IV.    Dessa Phi, DO [facility] [email or web address] 09/21/2018, 8:02 PM    Total critical care time: 6:37pm minutes. Time 8:02pm  Critical care time was exclusive of separately billable procedures and treating other patients. Critical care was necessary to treat or prevent imminent or life-threatening deterioration. Critical care was time spent personally by me on the following activities: development of  treatment plan with patient and/or surrogate as well as nursing, discussions with consultants, evaluation of patient's response to treatment, examination of patient, obtaining history from patient or surrogate, ordering and performing treatments and interventions, ordering and review of laboratory studies, ordering and review of radiographic studies, pulse oximetry and re-evaluation of patient's condition.

## 2018-09-21 NOTE — ED Provider Notes (Signed)
Signout from Dr Leonette Monarch. 85 year old male here with cough and fever.  Possible Covid case.  Lactate initially elevated and white blood cell count low.  Chest x-ray did not show any gross infiltrates.  Blood pressures soft here. Physical Exam  BP (!) 92/53   Pulse 87   Temp 98.6 F (37 C) (Oral)   Resp 17   Ht 5' 1.02" (1.55 m)   Wt 56.7 kg   SpO2 95%   BMI 23.60 kg/m   Physical Exam  ED Course/Procedures     Procedures  MDM  Hospitalist has been contacted by Dr. Leonette Monarch.  Plan is to follow blood pressures after repeat fluid bolus and if can be stabilized possible discharge.        Hayden Rasmussen, MD 09/21/18 [phone]

## 2018-09-21 NOTE — Progress Notes (Signed)
Patient arrived to 2M03. Tachycardic in 120s. SpO2 99% on 6LNC. RR 19 and BP 105/51 (68). Febrile to 103.34F, 650mg  tylenol given. Elink notified of arrival. Skin verified with Hinton Dyer, RN. Limited english speaking. Will continue to closely monitor.

## 2018-09-21 NOTE — ED Notes (Addendum)
Martin Bates (wife) cell phone- [phone] Please call with any questions and when the patient gets discharged for ride home.

## 2018-09-22 DIAGNOSIS — I471 Supraventricular tachycardia: Secondary | ICD-10-CM | POA: Diagnosis not present

## 2018-09-22 DIAGNOSIS — R6889 Other general symptoms and signs: Secondary | ICD-10-CM | POA: Diagnosis not present

## 2018-09-22 DIAGNOSIS — Z9079 Acquired absence of other genital organ(s): Secondary | ICD-10-CM | POA: Diagnosis not present

## 2018-09-22 DIAGNOSIS — R6520 Severe sepsis without septic shock: Secondary | ICD-10-CM | POA: Diagnosis present

## 2018-09-22 DIAGNOSIS — J449 Chronic obstructive pulmonary disease, unspecified: Secondary | ICD-10-CM | POA: Diagnosis present

## 2018-09-22 DIAGNOSIS — I248 Other forms of acute ischemic heart disease: Secondary | ICD-10-CM | POA: Diagnosis present

## 2018-09-22 DIAGNOSIS — A4151 Sepsis due to Escherichia coli [E. coli]: Secondary | ICD-10-CM | POA: Diagnosis not present

## 2018-09-22 DIAGNOSIS — A419 Sepsis, unspecified organism: Secondary | ICD-10-CM | POA: Diagnosis not present

## 2018-09-22 DIAGNOSIS — E872 Acidosis: Secondary | ICD-10-CM

## 2018-09-22 DIAGNOSIS — N179 Acute kidney failure, unspecified: Secondary | ICD-10-CM | POA: Diagnosis not present

## 2018-09-22 DIAGNOSIS — D649 Anemia, unspecified: Secondary | ICD-10-CM | POA: Diagnosis not present

## 2018-09-22 DIAGNOSIS — Z20828 Contact with and (suspected) exposure to other viral communicable diseases: Secondary | ICD-10-CM | POA: Diagnosis present

## 2018-09-22 DIAGNOSIS — Z79899 Other long term (current) drug therapy: Secondary | ICD-10-CM | POA: Diagnosis not present

## 2018-09-22 DIAGNOSIS — R7881 Bacteremia: Secondary | ICD-10-CM

## 2018-09-22 DIAGNOSIS — N281 Cyst of kidney, acquired: Secondary | ICD-10-CM | POA: Diagnosis not present

## 2018-09-22 DIAGNOSIS — N390 Urinary tract infection, site not specified: Secondary | ICD-10-CM

## 2018-09-22 DIAGNOSIS — R7989 Other specified abnormal findings of blood chemistry: Secondary | ICD-10-CM | POA: Diagnosis not present

## 2018-09-22 DIAGNOSIS — E785 Hyperlipidemia, unspecified: Secondary | ICD-10-CM | POA: Diagnosis not present

## 2018-09-22 DIAGNOSIS — R0902 Hypoxemia: Secondary | ICD-10-CM | POA: Diagnosis not present

## 2018-09-22 DIAGNOSIS — I2510 Atherosclerotic heart disease of native coronary artery without angina pectoris: Secondary | ICD-10-CM | POA: Diagnosis not present

## 2018-09-22 DIAGNOSIS — Z86010 Personal history of colonic polyps: Secondary | ICD-10-CM | POA: Diagnosis not present

## 2018-09-22 DIAGNOSIS — E876 Hypokalemia: Secondary | ICD-10-CM | POA: Diagnosis not present

## 2018-09-22 DIAGNOSIS — Z7984 Long term (current) use of oral hypoglycemic drugs: Secondary | ICD-10-CM | POA: Diagnosis not present

## 2018-09-22 DIAGNOSIS — E119 Type 2 diabetes mellitus without complications: Secondary | ICD-10-CM

## 2018-09-22 DIAGNOSIS — D72810 Lymphocytopenia: Secondary | ICD-10-CM | POA: Diagnosis present

## 2018-09-22 DIAGNOSIS — Z8546 Personal history of malignant neoplasm of prostate: Secondary | ICD-10-CM | POA: Diagnosis not present

## 2018-09-22 DIAGNOSIS — Z87891 Personal history of nicotine dependence: Secondary | ICD-10-CM | POA: Diagnosis not present

## 2018-09-22 DIAGNOSIS — R05 Cough: Secondary | ICD-10-CM | POA: Diagnosis present

## 2018-09-22 DIAGNOSIS — I700 Atherosclerosis of aorta: Secondary | ICD-10-CM | POA: Diagnosis present

## 2018-09-22 LAB — CBC WITH DIFFERENTIAL/PLATELET
Abs Immature Granulocytes: 0.14 10*3/uL — ABNORMAL HIGH (ref 0.00–0.07)
Basophils Absolute: 0 10*3/uL (ref 0.0–0.1)
Basophils Relative: 0 %
Eosinophils Absolute: 0 10*3/uL (ref 0.0–0.5)
Eosinophils Relative: 0 %
HCT: 29.6 % — ABNORMAL LOW (ref 39.0–52.0)
Hemoglobin: 9.9 g/dL — ABNORMAL LOW (ref 13.0–17.0)
Immature Granulocytes: 2 %
Lymphocytes Relative: 3 %
Lymphs Abs: 0.2 10*3/uL — ABNORMAL LOW (ref 0.7–4.0)
MCH: 30.5 pg (ref 26.0–34.0)
MCHC: 33.4 g/dL (ref 30.0–36.0)
MCV: 91.1 fL (ref 80.0–100.0)
Monocytes Absolute: 0.3 10*3/uL (ref 0.1–1.0)
Monocytes Relative: 4 %
Neutro Abs: 7.2 10*3/uL (ref 1.7–7.7)
Neutrophils Relative %: 91 %
Platelets: 79 10*3/uL — ABNORMAL LOW (ref 150–400)
RBC: 3.25 MIL/uL — ABNORMAL LOW (ref 4.22–5.81)
RDW: 11.4 % — ABNORMAL LOW (ref 11.5–15.5)
WBC Morphology: INCREASED
WBC: 7.9 10*3/uL (ref 4.0–10.5)
nRBC: 0 % (ref 0.0–0.2)

## 2018-09-22 LAB — BASIC METABOLIC PANEL
Anion gap: 14 (ref 5–15)
BUN: 25 mg/dL — ABNORMAL HIGH (ref 8–23)
CO2: 20 mmol/L — ABNORMAL LOW (ref 22–32)
Calcium: 8.7 mg/dL — ABNORMAL LOW (ref 8.9–10.3)
Chloride: 103 mmol/L (ref 98–111)
Creatinine, Ser: 1.39 mg/dL — ABNORMAL HIGH (ref 0.61–1.24)
GFR calc Af Amer: 53 mL/min — ABNORMAL LOW (ref >60)
GFR calc non Af Amer: 46 mL/min — ABNORMAL LOW (ref >60)
Glucose, Bld: 93 mg/dL (ref 70–99)
Potassium: 2.9 mmol/L — ABNORMAL LOW (ref 3.5–5.1)
Sodium: 137 mmol/L (ref 135–145)

## 2018-09-22 LAB — GLUCOSE, CAPILLARY
Glucose-Capillary: 119 mg/dL — ABNORMAL HIGH (ref 70–99)
Glucose-Capillary: 62 mg/dL — ABNORMAL LOW (ref 70–99)
Glucose-Capillary: 91 mg/dL (ref 70–99)
Glucose-Capillary: 84 mg/dL (ref 70–99)
Glucose-Capillary: 105 mg/dL — ABNORMAL HIGH (ref 70–99)

## 2018-09-22 LAB — MAGNESIUM: Magnesium: 1.4 mg/dL — ABNORMAL LOW (ref 1.7–2.4)

## 2018-09-22 MED ORDER — POTASSIUM CHLORIDE CRYS ER 20 MEQ PO TBCR
40.0000 meq | EXTENDED_RELEASE_TABLET | Freq: Two times a day (BID) | ORAL | Status: AC
  Administered 2018-09-22 (×2): 40 meq via ORAL
  Filled 2018-09-22 (×2): qty 2

## 2018-09-22 MED ORDER — SODIUM CHLORIDE 0.9 % IV SOLN
INTRAVENOUS | Status: DC
  Administered 2018-09-22 (×2): via INTRAVENOUS

## 2018-09-22 MED ORDER — POTASSIUM CHLORIDE CRYS ER 20 MEQ PO TBCR
40.0000 meq | EXTENDED_RELEASE_TABLET | ORAL | Status: AC
  Administered 2018-09-22 (×2): 40 meq via ORAL
  Filled 2018-09-22 (×2): qty 2

## 2018-09-22 MED ORDER — MAGNESIUM SULFATE 4 GM/100ML IV SOLN
4.0000 g | Freq: Once | INTRAVENOUS | Status: AC
  Administered 2018-09-22: 4 g via INTRAVENOUS
  Filled 2018-09-22: qty 100

## 2018-09-22 NOTE — Progress Notes (Signed)
PROGRESS NOTE    [name]  AOZ:[id number] DOB: 07/31/1930 DOA: 09/21/2018 PCP: Deland Pretty, MD   Brief Narrative:  HPI per Dr. Dessa Phi on 09/21/2018  Martin Bates is a 85 y.o. male with medical history significant of COPD, diabetes who presents with 2-day history of fever, chills and a dry cough.  He has been isolating at home with his wife, denies any known COVID-19 exposure.  He denies any shortness of breath, chest pain, nausea, vomiting, abdominal pain or diarrhea, dizziness or lightheadedness.  History gathered by myself using [place], through phone in patient's room.  ED Course: Lab obtained which revealed WBC 1.1, lymphocyte count 0.4.  Chest x-ray revealed mild left basilar subsegmental atelectasis without other active cardiopulmonary disease.  Influenza and respiratory panel negative.  He has not required any supplemental oxygen, however patient was referred for admission due to his low blood pressure in the 90s.  Patient remained asymptomatic as far as low blood pressure.  EDP felt patient was at high risk of decompensation due to his age and medical comorbidities including COPD and diabetes.  **His respiratory status improved significantly he defervesced after given acetaminophen.  His urine culture grew out E. coli and blood cultures also 2/2 showed E. coli.  Likely source is the urine and will obtain a scan and renal ultrasound if necessary.  Patient's position has been complicated by an AK I as well as low electrolytes which have been replete.  Patient was started back on gentle IV fluid hydration.  As of his respiratory status being stable he was transferred back to the progressive care unit and out of the ICU.  Assessment & Plan:   Principal Problem:   Suspected Covid-19 Virus Infection Active Problems:   DM type 2 (diabetes mellitus, type 2) (HCC)   Lactic acid acidosis   CAD (coronary artery disease)   Hyperlipidemia  Sepsis 2/2 E Coli Bacteremia and  associated UTI -Was febrile, tachypneic, tachycardic, leukopenic, and hypotensive on admission with lactic acidosis and now source of infection with a urinary tract infection and E. coli bacteremia -Sepsis physiology is improving is been afebrile now but still a little tachycardic and mildly tachypneic -Restarted IV fluid maintenance at 75 mL's per hour for 1 day -We will calcitonin level was 17.07 on admission lactic acidosis was 2.2 and improved to 1.6 -Alysis showed clear urine with yellow color, negative nitrites, negative ketones, many bacteria, 0-5 squamous epithelial cells, 21-50 WBCs, small leukocytes -Urine culture showed rater than 100,000 CFU of E Coli with Sensitivities pending -Cultures x2 showed gram-negative rods and initial culture shows Enterobacteriaceae species with E. Coli; Blood culture sensitivities are still pending -Continue Empiric Abx with IV Ceftriaxone 2 Grams and therapy as indicated from sensitivities -WBC went from 1.1 and improved to 3.4 has now 7.9 -We will cultures and continue to monitor patient's clinical status  Suspected COVID-19, lower Risk now that he is septic from E Coli Bacteremia -Patient presented with fever 102, lymphopenia -No acute cardiopulmonary disease on chest x-ray -Influenza and respiratory viral panel are negative -COVID-19 test pending and was placed on Airborne and enteric precautions -Labs were bedside and LDH was 126, ferritin level 2018, CRP was 11.2, procalcitonin level was high at 17.07 (suggesting Bacterial infection), SR was 53 and d-dimer was 3.88 -Patient remains Lymphopenic and his Lymphocyte Count was 200  Lactic Acidosis -Improved after IV fluid -Acid level went from 2.2 is now 1.6 We will continue with IV fluid hydration with  normal saline at 75 mL's per hour for 1 day  CAD -Continue Aspirin 81 mg po Daily and Atorvastatin 10 mg po qDaily  Hyperlipidemia -Continue Atorvastatin 10 mg po qHS -Currently Omega-3 Fatty  Acids 1000 mg po Capsules held  Type 2 Diabetes -Continue to Hold Home Metformin 1000 mg in AM and 500 mg in pm -Started Sensitive Novolog SSI AC -CBG's ranging from 62-130 -No HbA1c on File so will check in AM  -Continue to Monitor CBG's closely and adjust Insulin Regimen as Necessary  Hypokalemia -Patient's potassium level this Morning was 2.9 in the setting of loose stools  -Pleat with 40 mEq of p.o. potassium chloride twice daily x2 and then was given additional 40 mg twice daily later on today -Magnesium level was low at 1.4 now is replete with IV mag sulfate 4 g -Continue monitor replete as necessary -Repeat CMP in a.m.  Hypomagnesemia -Patient magnesium level this morning was 1.4 -Replete with IV mag sulfate 4 g -Continue monitor replete as necessary -Repeat magnesium level in a.m.  Acute kidney injury -The setting of sepsis and Loose stools -Patient's BUN/creatinine went from 19/1.02 and is now 25/1.39 -Worsened in the setting of sepsis and will check for urinary retention -We will likely need a renal ultrasound and IV fluid hydration has been restarted at 75 mL's per hour -Strict I's and O's  Normocytic Anemia -Patient's hemoglobin/hematocrit went from 11.3/34.4 and trended down to 10.7/31.6 and is now 9.9/21.6 -Again the setting of dilutional drop from IV fluid resuscitation -Check anemia panel in the a.m. -Continue monitor for signs and symptoms of bleeding -Repeat CBC in a.m.  Thrombocytopenia -In the setting of sepsis and E. coli bacteremia and infection -Platelet count went from 125,000 -> 79,000 -Continue to monitor for signs of bleeding -Continue to Monitor and Trend CBC   DVT prophylaxis: Enoxaparin 40 mg sq q24h Code Status: FULL CODE Family Communication: Discussed with wife over the phone Disposition Plan: Transfer back to Progressive Care as Respiratory Status is improved significantly and no longer hypoxic    Consultants:   PCCM/Pulmonary     Procedures:  None   Antimicrobials:  Anti-infectives (From admission, onward)   Start     Dose/Rate Route Frequency Ordered Stop   09/21/18 2200  cefTRIAXone (ROCEPHIN) 2 g in sodium chloride 0.9 % 100 mL IVPB     2 g 200 mL/hr over 30 Minutes Intravenous Daily at bedtime 09/21/18 1754     09/21/18 1800  cefTRIAXone (ROCEPHIN) 2 g in sodium chloride 0.9 % 100 mL IVPB  Status:  Discontinued     2 g 200 mL/hr over 30 Minutes Intravenous Daily at bedtime 09/21/18 1753 09/21/18 1754   09/21/18 0545  cefTRIAXone (ROCEPHIN) 2 g in sodium chloride 0.9 % 100 mL IVPB  Status:  Discontinued     2 g 200 mL/hr over 30 Minutes Intravenous Every 24 hours 09/21/18 0532 09/21/18 1031   09/21/18 0545  azithromycin (ZITHROMAX) 500 mg in sodium chloride 0.9 % 250 mL IVPB  Status:  Discontinued     500 mg 250 mL/hr over 60 Minutes Intravenous Every 24 hours 09/21/18 0532 09/21/18 1031     Subjective: Seen and examined at bedside he is doing better he felt. Spoke some English but able to communicate effectively. Not complaining of any shortness of breath but did have some mild abdominal pain and lower abdominal pain.  Was complaining that his condom cath fell off.  No lightheadedness or dizziness.  No other concerns  or complaints at this time.  Wife was updated via telephone conversation.  Objective: Vitals:   09/22/18 0400 09/22/18 0500 09/22/18 0600 09/22/18 0700  BP: (!) 87/63 (!) 93/56 (!) 108/55 107/63  Pulse: 94 88 86 81  Resp: 16 13 20 17   Temp:    97.9 F (36.6 C)  TempSrc:    Oral  SpO2: 98% 96% 97% 95%  Weight:      Height:        Intake/Output Summary (Last 24 hours) at 09/22/2018 0759 Last data filed at 09/22/2018 0400 Gross per 24 hour  Intake 258.44 ml  Output 300 ml  Net -41.56 ml   Filed Weights   09/21/18 0307 09/21/18 0930 09/21/18 2014  Weight: 56.7 kg 59.3 kg 58.6 kg   Examination: Physical Exam:  Constitutional: WN/WD thin Male in NAD and appears calm but slightly  uncomfortable Eyes: Lids and conjunctivae normal, sclerae anicteric  ENMT: External Ears, Nose appear normal. Grossly normal hearing.  Neck: Appears normal, supple, no cervical masses, normal ROM, no appreciable thyromegaly; no JVD Respiratory: Clear to auscultation bilaterally, no wheezing, rales, rhonchi or crackles. Normal respiratory effort and patient is not tachypenic. No accessory muscle use.  Cardiovascular: Slightly tachycardic but regular rate, no murmurs / rubs / gallops. S1 and S2 auscultated. No extremity edema  Abdomen: Soft, mildly tender, non-distended. No masses palpated. No appreciable hepatosplenomegaly. Bowel sounds positive x4.  GU: Deferred. Musculoskeletal: No clubbing / cyanosis of digits/nails. No joint deformity upper and lower extremities.   Skin: No rashes, lesions, ulcers on a limited skin evaluation. No induration; Warm and dry.  Neurologic: CN 2-12 grossly intact with no focal deficits.  Romberg sign and cerebellar reflexes not assessed.  Psychiatric: Normal judgment and insight. Alert and oriented x 3. Normal mood and appropriate affect.   Data Reviewed: I have personally reviewed following labs and imaging studies  CBC: Recent Labs  Lab 09/21/18 0323 09/21/18 1131 09/22/18 0330  WBC 1.1* 3.4* 7.9  NEUTROABS 0.7*  --  7.2  HGB 11.3* 10.7* 9.9*  HCT 34.4* 31.6* 29.6*  MCV 92.2 92.4 91.1  PLT 164 125* 79*   Basic Metabolic Panel: Recent Labs  Lab 09/21/18 0323 09/21/18 1131 09/22/18 0330  NA 136  --  137  K 4.1  --  2.9*  CL 101  --  103  CO2 22  --  20*  GLUCOSE 180*  --  93  BUN 19  --  25*  CREATININE 1.02 1.20 1.39*  CALCIUM 9.7  --  8.7*  MG  --   --  1.4*   GFR: Estimated Creatinine Clearance: 32.2 mL/min (A) (by C-G formula based on SCr of 1.39 mg/dL (H)). Liver Function Tests: Recent Labs  Lab 09/21/18 0323  AST 16  ALT 12  ALKPHOS 65  BILITOT 1.2  PROT 7.3  ALBUMIN 3.7   No results for input(s): LIPASE, AMYLASE in the  last 168 hours. No results for input(s): AMMONIA in the last 168 hours. Coagulation Profile: Recent Labs  Lab 09/21/18 0323  INR 1.0   Cardiac Enzymes: No results for input(s): CKTOTAL, CKMB, CKMBINDEX, TROPONINI in the last 168 hours. BNP (last 3 results) No results for input(s): PROBNP in the last 8760 hours. HbA1C: No results for input(s): HGBA1C in the last 72 hours. CBG: Recent Labs  Lab 09/21/18 1121 09/21/18 1605 09/21/18 2226  GLUCAP 105* 130* 110*   Lipid Profile: No results for input(s): CHOL, HDL, LDLCALC, TRIG, CHOLHDL, LDLDIRECT in the  last 72 hours. Thyroid Function Tests: No results for input(s): TSH, T4TOTAL, FREET4, T3FREE, THYROIDAB in the last 72 hours. Anemia Panel: Recent Labs    09/21/18 1131  FERRITIN 118   Sepsis Labs: Recent Labs  Lab 09/21/18 0323 09/21/18 0522 09/21/18 1131  PROCALCITON  --   --  17.07  LATICACIDVEN 2.2* 1.6  --     Recent Results (from the past 240 hour(s))  Culture, blood (Routine x 2)     Status: None (Preliminary result)   Collection Time: 09/21/18  3:00 AM  Result Value Ref Range Status   Specimen Description BLOOD RIGHT ARM  Final   Special Requests   Final    BOTTLES DRAWN AEROBIC AND ANAEROBIC Blood Culture results may not be optimal due to an excessive volume of blood received in culture bottles   Culture  Setup Time   Final    GRAM NEGATIVE RODS ANAEROBIC BOTTLE ONLY CRITICAL VALUE NOTED.  VALUE IS CONSISTENT WITH PREVIOUSLY REPORTED AND CALLED VALUE. Performed at [facility], [place] [address], [place], [place] [address]    Culture PENDING  Incomplete   Report Status PENDING  Incomplete  Culture, blood (Routine x 2)     Status: None (Preliminary result)   Collection Time: 09/21/18  3:10 AM  Result Value Ref Range Status   Specimen Description BLOOD LEFT ARM  Final   Special Requests   Final    BOTTLES DRAWN AEROBIC AND ANAEROBIC Blood Culture adequate volume   Culture  Setup Time   Final    GRAM  NEGATIVE RODS IN BOTH AEROBIC AND ANAEROBIC BOTTLES Organism ID to follow CRITICAL RESULT CALLED TO, READ BACK BY AND VERIFIED WITH: B MANCHERIL PHARMD 09/21/18 1740 JDW Performed at [facility], [address], [place], [place] [address]    Culture GRAM NEGATIVE RODS  Final   Report Status PENDING  Incomplete  Blood Culture ID Panel (Reflexed)     Status: Abnormal   Collection Time: 09/21/18  3:10 AM  Result Value Ref Range Status   Enterococcus species NOT DETECTED NOT DETECTED Final   Listeria monocytogenes NOT DETECTED NOT DETECTED Final   Staphylococcus species NOT DETECTED NOT DETECTED Final   Staphylococcus aureus (BCID) NOT DETECTED NOT DETECTED Final   Streptococcus species NOT DETECTED NOT DETECTED Final   Streptococcus agalactiae NOT DETECTED NOT DETECTED Final   Streptococcus pneumoniae NOT DETECTED NOT DETECTED Final   Streptococcus pyogenes NOT DETECTED NOT DETECTED Final   Acinetobacter baumannii NOT DETECTED NOT DETECTED Final   Enterobacteriaceae species DETECTED (A) NOT DETECTED Final    Comment: Enterobacteriaceae represent a large family of gram-negative bacteria, not a single organism. CRITICAL RESULT CALLED TO, READ BACK BY AND VERIFIED WITH: B MANCHERIL PHARMD 09/21/18 1740 JDW    Enterobacter cloacae complex NOT DETECTED NOT DETECTED Final   Escherichia coli DETECTED (A) NOT DETECTED Final    Comment: CRITICAL RESULT CALLED TO, READ BACK BY AND VERIFIED WITH: B MANCHERIL PHARMD 09/21/18 1740 JDW    Klebsiella oxytoca NOT DETECTED NOT DETECTED Final   Klebsiella pneumoniae NOT DETECTED NOT DETECTED Final   Proteus species NOT DETECTED NOT DETECTED Final   Serratia marcescens NOT DETECTED NOT DETECTED Final   Carbapenem resistance NOT DETECTED NOT DETECTED Final   Haemophilus influenzae NOT DETECTED NOT DETECTED Final   Neisseria meningitidis NOT DETECTED NOT DETECTED Final   Pseudomonas aeruginosa NOT DETECTED NOT DETECTED Final   Candida albicans NOT  DETECTED NOT DETECTED Final  Candida glabrata NOT DETECTED NOT DETECTED Final   Candida krusei NOT DETECTED NOT DETECTED Final   Candida parapsilosis NOT DETECTED NOT DETECTED Final   Candida tropicalis NOT DETECTED NOT DETECTED Final    Comment: Performed at [facility], [place] [address] [place], [place] [address]  Respiratory Panel by PCR     Status: None   Collection Time: 09/21/18  3:35 AM  Result Value Ref Range Status   Adenovirus NOT DETECTED NOT DETECTED Final   Coronavirus 229E NOT DETECTED NOT DETECTED Final    Comment: (NOTE) The Coronavirus on the Respiratory Panel, DOES NOT test for the novel  Coronavirus (2019 nCoV)    Coronavirus HKU1 NOT DETECTED NOT DETECTED Final   Coronavirus NL63 NOT DETECTED NOT DETECTED Final   Coronavirus OC43 NOT DETECTED NOT DETECTED Final   Metapneumovirus NOT DETECTED NOT DETECTED Final   Rhinovirus / Enterovirus NOT DETECTED NOT DETECTED Final   Influenza A NOT DETECTED NOT DETECTED Final   Influenza B NOT DETECTED NOT DETECTED Final   Parainfluenza Virus 1 NOT DETECTED NOT DETECTED Final   Parainfluenza Virus 2 NOT DETECTED NOT DETECTED Final   Parainfluenza Virus 3 NOT DETECTED NOT DETECTED Final   Parainfluenza Virus 4 NOT DETECTED NOT DETECTED Final   Respiratory Syncytial Virus NOT DETECTED NOT DETECTED Final   Bordetella pertussis NOT DETECTED NOT DETECTED Final   Chlamydophila pneumoniae NOT DETECTED NOT DETECTED Final   Mycoplasma pneumoniae NOT DETECTED NOT DETECTED Final    Comment: Performed at [facility], [place]. [address], [place], [place] [address]    Radiology Studies: Dg Chest Portable 1 View  Result Date: 09/21/2018 CLINICAL DATA:  Initial evaluation for acute cough, shortness of breath. EXAM: PORTABLE CHEST 1 VIEW COMPARISON:  Prior radiograph from 07/10/2004. FINDINGS: The cardiac and mediastinal silhouettes are stable in size and contour, and remain within normal limits. Aortic atherosclerosis. The lungs  are normally inflated. Mild left basilar subsegmental atelectasis. No airspace consolidation, pleural effusion, or pulmonary edema is identified. There is no pneumothorax. No acute osseous abnormality identified. IMPRESSION: 1. Mild left basilar subsegmental atelectasis. No other active cardiopulmonary disease. 2. Aortic atherosclerosis. Electronically Signed   By: Jeannine Boga M.D.   On: 09/21/2018 03:56   Scheduled Meds: . aspirin  81 mg Oral Daily  . atorvastatin  10 mg Oral Daily  . enoxaparin (LOVENOX) injection  40 mg Subcutaneous Q24H  . insulin aspart  0-9 Units Subcutaneous TID WC  . potassium chloride  40 mEq Oral Q4H   Continuous Infusions: . cefTRIAXone (ROCEPHIN)  IV Stopped (09/21/18 2212)  . magnesium sulfate 1 - 4 g bolus IVPB      LOS: 0 days   Kerney Elbe, DO [facility] PAGER is on [place]  If 7PM-7AM, please contact night-coverage [email or web address] Password [email or web address] 09/22/2018, 7:59 AM

## 2018-09-22 NOTE — Plan of Care (Signed)
  Problem: Education: Goal: Knowledge of General Education information will improve Description Including pain rating scale, medication(s)/side effects and non-pharmacologic comfort measures Outcome: Progressing   Problem: Health Behavior/Discharge Planning: Goal: Ability to manage health-related needs will improve Outcome: Progressing   Problem: Clinical Measurements: Goal: Ability to maintain clinical measurements within normal limits will improve Outcome: Progressing Goal: Will remain free from infection Outcome: Progressing Goal: Diagnostic test results will improve Outcome: Progressing Goal: Respiratory complications will improve Outcome: Progressing Goal: Cardiovascular complication will be avoided Outcome: Progressing   Problem: Activity: Goal: Risk for activity intolerance will decrease Outcome: Progressing   Problem: Nutrition: Goal: Adequate nutrition will be maintained Outcome: Progressing   Problem: Coping: Goal: Level of anxiety will decrease Outcome: Progressing   Problem: Elimination: Goal: Will not experience complications related to bowel motility Outcome: Progressing Goal: Will not experience complications related to urinary retention Outcome: Progressing   Problem: Pain Managment: Goal: General experience of comfort will improve Outcome: Progressing   Problem: Safety: Goal: Ability to remain free from injury will improve Outcome: Progressing   Problem: Skin Integrity: Goal: Risk for impaired skin integrity will decrease Outcome: Progressing  Patient is a/o x4. VSS. K=2.9 this AM. K+/Mg replaced. Incontinent of diarrhea x2. Family updated via phone. Will continue to monitor.

## 2018-09-23 ENCOUNTER — Encounter: Payer: Self-pay | Admitting: *Deleted

## 2018-09-23 LAB — CBC WITH DIFFERENTIAL/PLATELET
Abs Immature Granulocytes: 0.59 10*3/uL — ABNORMAL HIGH (ref 0.00–0.07)
Basophils Absolute: 0 10*3/uL (ref 0.0–0.1)
Basophils Relative: 0 %
Eosinophils Absolute: 0 10*3/uL (ref 0.0–0.5)
Eosinophils Relative: 0 %
HCT: 30.2 % — ABNORMAL LOW (ref 39.0–52.0)
Hemoglobin: 10.2 g/dL — ABNORMAL LOW (ref 13.0–17.0)
Immature Granulocytes: 6 %
Lymphocytes Relative: 6 %
Lymphs Abs: 0.6 10*3/uL — ABNORMAL LOW (ref 0.7–4.0)
MCH: 30.4 pg (ref 26.0–34.0)
MCHC: 33.8 g/dL (ref 30.0–36.0)
MCV: 90.1 fL (ref 80.0–100.0)
Monocytes Absolute: 0.2 10*3/uL (ref 0.1–1.0)
Monocytes Relative: 2 %
Neutro Abs: 8.9 10*3/uL — ABNORMAL HIGH (ref 1.7–7.7)
Neutrophils Relative %: 86 %
Platelets: 73 10*3/uL — ABNORMAL LOW (ref 150–400)
RBC: 3.35 MIL/uL — ABNORMAL LOW (ref 4.22–5.81)
RDW: 11.2 % — ABNORMAL LOW (ref 11.5–15.5)
WBC: 10.2 10*3/uL (ref 4.0–10.5)
nRBC: 0 % (ref 0.0–0.2)

## 2018-09-23 LAB — COMPREHENSIVE METABOLIC PANEL
ALT: 23 U/L (ref 0–44)
ALT: 21 U/L (ref 0–44)
AST: 53 U/L — ABNORMAL HIGH (ref 15–41)
AST: 42 U/L — ABNORMAL HIGH (ref 15–41)
Albumin: 2.9 g/dL — ABNORMAL LOW (ref 3.5–5.0)
Albumin: 2.6 g/dL — ABNORMAL LOW (ref 3.5–5.0)
Alkaline Phosphatase: 110 U/L (ref 38–126)
Alkaline Phosphatase: 77 U/L (ref 38–126)
Anion gap: 14 (ref 5–15)
Anion gap: 12 (ref 5–15)
BUN: 24 mg/dL — ABNORMAL HIGH (ref 8–23)
BUN: 24 mg/dL — ABNORMAL HIGH (ref 8–23)
CO2: 15 mmol/L — ABNORMAL LOW (ref 22–32)
CO2: 17 mmol/L — ABNORMAL LOW (ref 22–32)
Calcium: 8.8 mg/dL — ABNORMAL LOW (ref 8.9–10.3)
Calcium: 8.7 mg/dL — ABNORMAL LOW (ref 8.9–10.3)
Chloride: 107 mmol/L (ref 98–111)
Chloride: 109 mmol/L (ref 98–111)
Creatinine, Ser: 1.14 mg/dL (ref 0.61–1.24)
Creatinine, Ser: 1.04 mg/dL (ref 0.61–1.24)
GFR calc Af Amer: >60 mL/min (ref >60)
GFR calc Af Amer: >60 mL/min (ref >60)
GFR calc non Af Amer: 58 mL/min — ABNORMAL LOW (ref >60)
GFR calc non Af Amer: >60 mL/min (ref >60)
Glucose, Bld: 139 mg/dL — ABNORMAL HIGH (ref 70–99)
Glucose, Bld: 115 mg/dL — ABNORMAL HIGH (ref 70–99)
Potassium: 4.7 mmol/L (ref 3.5–5.1)
Potassium: 4.5 mmol/L (ref 3.5–5.1)
Sodium: 136 mmol/L (ref 135–145)
Sodium: 138 mmol/L (ref 135–145)
Total Bilirubin: 1 mg/dL (ref 0.3–1.2)
Total Bilirubin: 0.6 mg/dL (ref 0.3–1.2)
Total Protein: 6.2 g/dL — ABNORMAL LOW (ref 6.5–8.1)
Total Protein: 5.6 g/dL — ABNORMAL LOW (ref 6.5–8.1)

## 2018-09-23 LAB — GLUCOSE, CAPILLARY
Glucose-Capillary: 147 mg/dL — ABNORMAL HIGH (ref 70–99)
Glucose-Capillary: 154 mg/dL — ABNORMAL HIGH (ref 70–99)

## 2018-09-23 LAB — URINE CULTURE: Culture: >=100000 — AB

## 2018-09-23 LAB — CULTURE, BLOOD (ROUTINE X 2): Special Requests: ADEQUATE

## 2018-09-23 LAB — PHOSPHORUS: Phosphorus: 1.6 mg/dL — ABNORMAL LOW (ref 2.5–4.6)

## 2018-09-23 LAB — FERRITIN: Ferritin: 226 ng/mL (ref 24–336)

## 2018-09-23 LAB — MAGNESIUM: Magnesium: 2.2 mg/dL (ref 1.7–2.4)

## 2018-09-23 LAB — LACTATE DEHYDROGENASE: LDH: 199 U/L — ABNORMAL HIGH (ref 98–192)

## 2018-09-23 LAB — CK: Total CK: 493 U/L — ABNORMAL HIGH (ref 49–397)

## 2018-09-23 LAB — C-REACTIVE PROTEIN: CRP: 23.4 mg/dL — ABNORMAL HIGH (ref <1.0)

## 2018-09-23 LAB — D-DIMER, QUANTITATIVE: D-Dimer, Quant: 5.17 ug/mL-FEU — ABNORMAL HIGH (ref 0.00–0.50)

## 2018-09-23 MED ORDER — ENOXAPARIN SODIUM 60 MG/0.6ML ~~LOC~~ SOLN
60.0000 mg | Freq: Two times a day (BID) | SUBCUTANEOUS | Status: DC
  Administered 2018-09-23 – 2018-09-25 (×5): 60 mg via SUBCUTANEOUS
  Filled 2018-09-23 (×6): qty 0.6

## 2018-09-23 MED ORDER — METOPROLOL TARTRATE 5 MG/5ML IV SOLN
5.0000 mg | Freq: Once | INTRAVENOUS | Status: AC
  Administered 2018-09-23: 5 mg via INTRAVENOUS
  Filled 2018-09-23: qty 5

## 2018-09-23 MED ORDER — K PHOS MONO-SOD PHOS DI & MONO 155-852-130 MG PO TABS
500.0000 mg | ORAL_TABLET | Freq: Two times a day (BID) | ORAL | Status: AC
  Administered 2018-09-23 (×2): 500 mg via ORAL
  Filled 2018-09-23 (×2): qty 2

## 2018-09-23 MED ORDER — SODIUM BICARBONATE 8.4 % IV SOLN
INTRAVENOUS | Status: AC
  Administered 2018-09-23: 12:00:00 via INTRAVENOUS
  Filled 2018-09-23 (×2): qty 150

## 2018-09-23 MED ORDER — LOPERAMIDE HCL 2 MG PO CAPS: 2.0000 mg | ORAL_CAPSULE | ORAL | Status: DC | PRN

## 2018-09-23 MED ORDER — MORPHINE SULFATE (PF) 2 MG/ML IV SOLN
2.0000 mg | Freq: Once | INTRAVENOUS | Status: AC
  Administered 2018-09-23: 2 mg via INTRAVENOUS
  Filled 2018-09-23: qty 1

## 2018-09-23 NOTE — Congregational Nurse Program (Signed)
  Dept: [facility] Nurse Program Note  Date of Encounter: 09/22/2018 Past Medical History: Past Medical History:  Diagnosis Date  . Anemia    Normocytic  . Cataracts, bilateral   . COPD (chronic obstructive pulmonary disease) ([place])   . Hyperlipidemia   . Internal hemorrhoids   . Prostate cancer ([place])   . Pulmonary emphysema ([place])    patient denies  . Tubulovillous adenoma of colon 2005  . Uncontrolled diabetes mellitus with microalbuminuria or microproteinuria     Encounter Details:  wife called that pt was in [facility] hospital but did not know the room number. Said fever and shivers started Thursday am, pt. Refused to call ambulance, so she drove to [place] Prior admit Dr.Pharr recommended to check with cardiology, and cardiology saw something but it was okay. [facility] called to his son who lives in [place] updated his condition, found bacteria. Pt. Called his wife yesterday afternoon that he felt better. Wife knows visiting restriction, emotional support given. Will f/p

## 2018-09-23 NOTE — Progress Notes (Signed)
Bed alarm ring, staff came to room. Assisted pt back to bed.No injuries noted. HR elevated as before mid to high 130's. Charge nurse made aware-reports will make MD aware.

## 2018-09-23 NOTE — Progress Notes (Signed)
PROGRESS NOTE    Martin Bates  QRF:[id number] DOB: 02/23/1931 DOA: 09/21/2018 PCP: Deland Pretty, MD   Brief Narrative:  HPI per Dr. Dessa Phi on 09/21/2018  Martin Bates is a 85 y.o. male with medical history significant of COPD, diabetes who presents with 2-day history of fever, chills and a dry cough.  He has been isolating at home with his wife, denies any known COVID-19 exposure.  He denies any shortness of breath, chest pain, nausea, vomiting, abdominal pain or diarrhea, dizziness or lightheadedness.  History gathered by myself using [place], through phone in patient's room.  ED Course: Lab obtained which revealed WBC 1.1, lymphocyte count 0.4.  Chest x-ray revealed mild left basilar subsegmental atelectasis without other active cardiopulmonary disease.  Influenza and respiratory panel negative.  He has not required any supplemental oxygen, however patient was referred for admission due to his low blood pressure in the 90s.  Patient remained asymptomatic as far as low blood pressure.  EDP felt patient was at high risk of decompensation due to his age and medical comorbidities including COPD and diabetes.  **His respiratory status improved significantly and he defervesced after given acetaminophen but spiked another temperature overnight.  His urine culture grew out E. coli and blood cultures also 2/2 showed E. coli.  Likely source is the urine and will obtain a scan and renal ultrasound if necessary.  Patient's Hospitalization has been complicated by an AKI as well as low electrolytes which have been replete.  Patient was started back on gentle IV fluid hydration and this was changed to Sodium Bicarbonate.  Because of his respiratory status being stable he was transferred back to the progressive care unit and out of the ICU. Overnight 4/10/-4/11 he became Tachycardic, Tachypenic and Febrile and had to be placed back on 2 Liters of O2 but he is improved and off of O2 now.   Assessment  & Plan:   Principal Problem:   Suspected Covid-19 Virus Infection Active Problems:   DM type 2 (diabetes mellitus, type 2) (HCC)   Lactic acid acidosis   CAD (coronary artery disease)   Hyperlipidemia  Sepsis 2/2 E Coli Bacteremia and associated UTI -Was febrile, tachypneic, tachycardic, leukopenic, and hypotensive on admission with lactic acidosis and now source of infection with a urinary tract infection and E. coli bacteremia -Sepsis physiology is improving but he became febrile again this AM and wasa tachycardic and mildly tachypneic -Restarted IV fluid maintenance at 75 mL's per hour for 1 day and changed to Sodium Bicarbonate 150 mEQ at 75 mL/hr -Procalcitonin level was 17.07 on admission lactic acidosis was 2.2 and improved to 1.6 -Urinalysis showed clear urine with yellow color, negative nitrites, negative ketones, many bacteria, 0-5 squamous epithelial cells, 21-50 WBCs, small leukocytes -Urine culture showed rater than 100,000 CFU of E Coli with Sensitivities pending -Cultures x2 showed gram-negative rods and initial culture shows Enterobacteriaceae species with E. Coli; Blood culture sensitivities are pansensitive -Continue Empiric Abx with IV Ceftriaxone 2 Grams and therapy as indicated from sensitivities -Repeated Blood Cx and this is pending  -WBC went from 1.1 -> 3.4 -> 7.9 -> 10.2 -We will cultures and continue to monitor patient's clinical status  Suspected COVID-19, lower Risk now that he is septic from E Coli Bacteremia -Patient presented with fever 102, lymphopenia -No acute cardiopulmonary disease on chest x-ray -Influenza and respiratory viral panel are negative -COVID-19 test pending and was placed on Airborne and enteric precautions -Labs were bedside and LDH  was 126, ferritin level 118, CRP was 11.2, procalcitonin level was high at 17.07 (suggesting Bacterial infection), ESR was 53 and d-dimer was 3.88 -Repeat Labs this AM showed CK of 493, LDH of 199, Ferritin  of 226, and D-Dimer of 5.17 -Will start Full Dose AC with Lovenox given concern for Microthrombi given uptrending D-Dimer; Will trend D-Dimers Daily  -Patient remains Lymphopenic and his Lymphocyte Count was 200  Lactic Acidosis -Improved after IV fluid -Acid level went from 2.2 is now 1.6 -We will continue with IV fluid hydration with normal saline at 75 mL's per hour for 1 day and changed to Sodium Bicarbonate 150 mEQ at 75 mL/hr  CAD -Continue Aspirin 81 mg po Daily and Atorvastatin 10 mg po qDaily  Hyperlipidemia -Continue Atorvastatin 10 mg po qHS -Currently Omega-3 Fatty Acids 1000 mg po Capsules held  Type 2 Diabetes -Continue to Hold Home Metformin 1000 mg in AM and 500 mg in pm -Started Sensitive Novolog SSI AC -CBG's ranging from 62-147 -No HbA1c on File so will check in AM  -Continue to Monitor CBG's closely and adjust Insulin Regimen as Necessary  Hypokalemia -Patient's potassium level this Morning was 2.9 in the setting of loose stools; Now improved to 4.5 -Magnesium level now 2.2 -Continue monitor replete as necessary -Repeat CMP in a.m.  Hypomagnesemia -Patient magnesium level was 1.4 and improved to 1.6 -Replete with IV mag sulfate 4 g yesterday -Continue monitor replete as necessary -Repeat magnesium level in a.m.  Hypophosphatemia -Patient's Phos Level was 1.6 this AM -Replete with po KPhos Neutral 500 mg po BID -Continue to Monitor and Replete as Necessary -Repeat Phos Level in the AM   Acute kidney injury, improving  -The setting of sepsis and Loose stools -Patient's BUN/creatinine went from 19/1.02 worsened to 25/1.39; BUN/Cr is trending down and has gone from 24/1.14 -> 24/1.04 -Had Worsened in the setting of sepsis and will check for urinary retention -We will likely need a renal ultrasound and IV fluid hydration has been restarted at 75 mL's per hour but changed to Sodium Bicarbonate -Strict I's and O's  Normocytic Anemia -Patient's  hemoglobin/hematocrit went from 11.3/34.4 and trended down to 10.7/31.6 and is now 9.9/21.6 -> 10.2/30.2 -Again the setting of dilutional drop from IV fluid resuscitation -Check Anemia panel in the a.m. -Continue monitor for signs and symptoms of bleeding -Repeat CBC in a.m.  Thrombocytopenia -In the setting of sepsis and E. coli bacteremia and infection -Platelet count went from 125,000 -> 79,000 -> 73,000 -Continue to monitor for signs of bleeding now that he is being started on FULL DOSE AC -Continue to Monitor and Trend CBC   Non-Gap Metabolic Acidosis -Patient's CO2 was 15 and AG was 14, Now Improved slightly as CO2 is 17 and AG is 12 -Started Patient on Sodium Bicarbonate 150 mEQ in D5W at 75 mL/hr -Continue to Monitor and Trend -Repeat CMP in AM   DVT prophylaxis: Enoxaparin 40 mg sq q24h changed to 1 mg/kg BID  Code Status: FULL CODE Family Communication: Discussed with wife over the phone Disposition Plan: Transfer back to Progressive Care as Respiratory Status is improved significantly and no longer hypoxic    Consultants:   PCCM/Pulmonary    Procedures:  None   Antimicrobials:  Anti-infectives (From admission, onward)   Start     Dose/Rate Route Frequency Ordered Stop   09/21/18 2200  cefTRIAXone (ROCEPHIN) 2 g in sodium chloride 0.9 % 100 mL IVPB     2 g 200 mL/hr over  30 Minutes Intravenous Daily at bedtime 09/21/18 1754     09/21/18 1800  cefTRIAXone (ROCEPHIN) 2 g in sodium chloride 0.9 % 100 mL IVPB  Status:  Discontinued     2 g 200 mL/hr over 30 Minutes Intravenous Daily at bedtime 09/21/18 1753 09/21/18 1754   09/21/18 0545  cefTRIAXone (ROCEPHIN) 2 g in sodium chloride 0.9 % 100 mL IVPB  Status:  Discontinued     2 g 200 mL/hr over 30 Minutes Intravenous Every 24 hours 09/21/18 0532 09/21/18 1031   09/21/18 0545  azithromycin (ZITHROMAX) 500 mg in sodium chloride 0.9 % 250 mL IVPB  Status:  Discontinued     500 mg 250 mL/hr over 60 Minutes Intravenous  Every 24 hours 09/21/18 0532 09/21/18 1031     Subjective: Seen and examined at bedside and states that he was doing well.  Any chest pain, shortness with nausea or vomiting.  Denies any abdominal pain today.  States that he is peeing okay.  No other concerns or complaints at this time and wanted to know if he could go home tomorrow.   Objective: Vitals:   09/23/18 0210 09/23/18 0243 09/23/18 0255 09/23/18 1130  BP:  (!) 116/53  117/63  Pulse: (!) 132 (!) 131  72  Resp: (!) 30 (!) 23  19  Temp:  (!) 101 F (38.3 C) (!) 100.6 F (38.1 C) 98.5 F (36.9 C)  TempSrc:  Oral Oral Oral  SpO2: 95% 93%  98%  Weight:      Height:        Intake/Output Summary (Last 24 hours) at 09/23/2018 1307 Last data filed at 09/22/2018 2030 Gross per 24 hour  Intake -  Output 475 ml  Net -475 ml   Filed Weights   09/21/18 0930 09/21/18 2014 09/22/18 2319  Weight: 59.3 kg 58.6 kg 57.2 kg   Examination: Physical Exam:  Constitutional: Nourished, well-developed thin [place] Male in NAD appears calm and pleasant this AM Eyes: Lids and conjunctive are normal.  Sclera anicteric ENMT: External ears and nose appear normal.  Grossly normal hearing. Neck: Supple no JVD Respiratory: Clear to auscultation bilateral no appreciable wheezing, rales, rhonchi.  Patient not tachypneic using accessory muscles to breathe. Cardiovascular: RRR this morning no appreciable murmurs, rubs, gallops.  No lower extremity edema noted. Abdomen: Soft, mildly tender, nondistended.  Bowel sounds present GU: Deferred. Musculoskeletal: No clubbing or cyanosis.  No joint deformities in upper extremities. Skin: Is warm and dry with no appreciable rashes or lesions on to skin evaluation. Neurologic: No nerves II through XII grossly intact no appreciable focal deficits.  Romberg sign is cerebellar reflexes were not assessed. Psychiatric: Normal judgment and insight.  Patient is awake, alert, and oriented x3.  Has a pleasant mood and  affect.  Data Reviewed: I have personally reviewed following labs and imaging studies  CBC: Recent Labs  Lab 09/21/18 0323 09/21/18 1131 09/22/18 0330 09/23/18 0303  WBC 1.1* 3.4* 7.9 10.2  NEUTROABS 0.7*  --  7.2 8.9*  HGB 11.3* 10.7* 9.9* 10.2*  HCT 34.4* 31.6* 29.6* 30.2*  MCV 92.2 92.4 91.1 90.1  PLT 164 125* 79* 73*   Basic Metabolic Panel: Recent Labs  Lab 09/21/18 0323 09/21/18 1131 09/22/18 0330 09/23/18 0303 09/23/18 0831  NA 136  --  137 136 138  K 4.1  --  2.9* 4.7 4.5  CL 101  --  103 107 109  CO2 22  --  20* 15* 17*  GLUCOSE 180*  --  93 139* 115*  BUN 19  --  25* 24* 24*  CREATININE 1.02 1.20 1.39* 1.14 1.04  CALCIUM 9.7  --  8.7* 8.8* 8.7*  MG  --   --  1.4* 2.2  --   PHOS  --   --   --  1.6*  --    GFR: Estimated Creatinine Clearance: 42 mL/min (by C-G formula based on SCr of 1.04 mg/dL). Liver Function Tests: Recent Labs  Lab 09/21/18 0323 09/23/18 0303 09/23/18 0831  AST 16 53* 42*  ALT 12 23 21   ALKPHOS 65 110 77  BILITOT 1.2 1.0 0.6  PROT 7.3 6.2* 5.6*  ALBUMIN 3.7 2.9* 2.6*   No results for input(s): LIPASE, AMYLASE in the last 168 hours. No results for input(s): AMMONIA in the last 168 hours. Coagulation Profile: Recent Labs  Lab 09/21/18 0323  INR 1.0   Cardiac Enzymes: Recent Labs  Lab 09/23/18 0905  CKTOTAL 493*   BNP (last 3 results) No results for input(s): PROBNP in the last 8760 hours. HbA1C: No results for input(s): HGBA1C in the last 72 hours. CBG: Recent Labs  Lab 09/22/18 0816 09/22/18 1221 09/22/18 1724 09/22/18 2218 09/23/18 1124  GLUCAP 62* 91 84 105* 147*   Lipid Profile: No results for input(s): CHOL, HDL, LDLCALC, TRIG, CHOLHDL, LDLDIRECT in the last 72 hours. Thyroid Function Tests: No results for input(s): TSH, T4TOTAL, FREET4, T3FREE, THYROIDAB in the last 72 hours. Anemia Panel: Recent Labs    09/21/18 1131 09/23/18 0303  FERRITIN 118 226   Sepsis Labs: Recent Labs  Lab 09/21/18  0323 09/21/18 0522 09/21/18 1131  PROCALCITON  --   --  17.07  LATICACIDVEN 2.2* 1.6  --     Recent Results (from the past 240 hour(s))  Culture, blood (Routine x 2)     Status: Abnormal   Collection Time: 09/21/18  3:00 AM  Result Value Ref Range Status   Specimen Description BLOOD RIGHT ARM  Final   Special Requests   Final    BOTTLES DRAWN AEROBIC AND ANAEROBIC Blood Culture results may not be optimal due to an excessive volume of blood received in culture bottles   Culture  Setup Time   Final    GRAM NEGATIVE RODS ANAEROBIC BOTTLE ONLY CRITICAL VALUE NOTED.  VALUE IS CONSISTENT WITH PREVIOUSLY REPORTED AND CALLED VALUE.    Culture (A)  Final    ESCHERICHIA COLI SUSCEPTIBILITIES PERFORMED ON PREVIOUS CULTURE WITHIN THE LAST 5 DAYS. Performed at [facility], [place] [address], [place], [place] [address]    Report Status 09/23/2018 FINAL  Final  Culture, blood (Routine x 2)     Status: Abnormal   Collection Time: 09/21/18  3:10 AM  Result Value Ref Range Status   Specimen Description BLOOD LEFT ARM  Final   Special Requests   Final    BOTTLES DRAWN AEROBIC AND ANAEROBIC Blood Culture adequate volume   Culture  Setup Time   Final    GRAM NEGATIVE RODS IN BOTH AEROBIC AND ANAEROBIC BOTTLES CRITICAL RESULT CALLED TO, READ BACK BY AND VERIFIED WITHAsencion Islam [facility] 09/21/18 1740 JDW Performed at [facility], [place] [address] [place], [place] [address]    Culture ESCHERICHIA COLI (A)  Final   Report Status 09/23/2018 FINAL  Final   Organism ID, Bacteria ESCHERICHIA COLI  Final      Susceptibility   Escherichia coli - MIC*    AMPICILLIN 4 SENSITIVE Sensitive  CEFAZOLIN <=4 SENSITIVE Sensitive     CEFEPIME <=1 SENSITIVE Sensitive     CEFTAZIDIME <=1 SENSITIVE Sensitive     CEFTRIAXONE <=1 SENSITIVE Sensitive     CIPROFLOXACIN <=0.25 SENSITIVE Sensitive     GENTAMICIN <=1 SENSITIVE Sensitive     IMIPENEM <=0.25 SENSITIVE Sensitive     TRIMETH/SULFA <=20  SENSITIVE Sensitive     AMPICILLIN/SULBACTAM <=2 SENSITIVE Sensitive     PIP/TAZO <=4 SENSITIVE Sensitive     Extended ESBL NEGATIVE Sensitive     * ESCHERICHIA COLI  Blood Culture ID Panel (Reflexed)     Status: Abnormal   Collection Time: 09/21/18  3:10 AM  Result Value Ref Range Status   Enterococcus species NOT DETECTED NOT DETECTED Final   Listeria monocytogenes NOT DETECTED NOT DETECTED Final   Staphylococcus species NOT DETECTED NOT DETECTED Final   Staphylococcus aureus (BCID) NOT DETECTED NOT DETECTED Final   Streptococcus species NOT DETECTED NOT DETECTED Final   Streptococcus agalactiae NOT DETECTED NOT DETECTED Final   Streptococcus pneumoniae NOT DETECTED NOT DETECTED Final   Streptococcus pyogenes NOT DETECTED NOT DETECTED Final   Acinetobacter baumannii NOT DETECTED NOT DETECTED Final   Enterobacteriaceae species DETECTED (A) NOT DETECTED Final    Comment: Enterobacteriaceae represent a large family of gram-negative bacteria, not a single organism. CRITICAL RESULT CALLED TO, READ BACK BY AND VERIFIED WITH: B MANCHERIL PHARMD 09/21/18 1740 JDW    Enterobacter cloacae complex NOT DETECTED NOT DETECTED Final   Escherichia coli DETECTED (A) NOT DETECTED Final    Comment: CRITICAL RESULT CALLED TO, READ BACK BY AND VERIFIED WITH: B MANCHERIL PHARMD 09/21/18 1740 JDW    Klebsiella oxytoca NOT DETECTED NOT DETECTED Final   Klebsiella pneumoniae NOT DETECTED NOT DETECTED Final   Proteus species NOT DETECTED NOT DETECTED Final   Serratia marcescens NOT DETECTED NOT DETECTED Final   Carbapenem resistance NOT DETECTED NOT DETECTED Final   Haemophilus influenzae NOT DETECTED NOT DETECTED Final   Neisseria meningitidis NOT DETECTED NOT DETECTED Final   Pseudomonas aeruginosa NOT DETECTED NOT DETECTED Final   Candida albicans NOT DETECTED NOT DETECTED Final   Candida glabrata NOT DETECTED NOT DETECTED Final   Candida krusei NOT DETECTED NOT DETECTED Final   Candida parapsilosis NOT  DETECTED NOT DETECTED Final   Candida tropicalis NOT DETECTED NOT DETECTED Final    Comment: Performed at [facility], [place] [address] [place],  [address]  Respiratory Panel by PCR     Status: None   Collection Time: 09/21/18  3:35 AM  Result Value Ref Range Status   Adenovirus NOT DETECTED NOT DETECTED Final   Coronavirus 229E NOT DETECTED NOT DETECTED Final    Comment: (NOTE) The Coronavirus on the Respiratory Panel, DOES NOT test for the novel  Coronavirus (2019 nCoV)    Coronavirus HKU1 NOT DETECTED NOT DETECTED Final   Coronavirus NL63 NOT DETECTED NOT DETECTED Final   Coronavirus OC43 NOT DETECTED NOT DETECTED Final   Metapneumovirus NOT DETECTED NOT DETECTED Final   Rhinovirus / Enterovirus NOT DETECTED NOT DETECTED Final   Influenza A NOT DETECTED NOT DETECTED Final   Influenza B NOT DETECTED NOT DETECTED Final   Parainfluenza Virus 1 NOT DETECTED NOT DETECTED Final   Parainfluenza Virus 2 NOT DETECTED NOT DETECTED Final   Parainfluenza Virus 3 NOT DETECTED NOT DETECTED Final   Parainfluenza Virus 4 NOT DETECTED NOT DETECTED Final   Respiratory Syncytial Virus NOT DETECTED NOT DETECTED Final   Bordetella pertussis NOT DETECTED  NOT DETECTED Final   Chlamydophila pneumoniae NOT DETECTED NOT DETECTED Final   Mycoplasma pneumoniae NOT DETECTED NOT DETECTED Final    Comment: Performed at [facility], [place] [address] [place], [place] [address]  Urine culture     Status: Abnormal   Collection Time: 09/21/18  6:57 AM  Result Value Ref Range Status   Specimen Description URINE, CLEAN CATCH  Final   Special Requests   Final    NONE Performed at [facility], [place] [address], [place], [place] [address]    Culture >=100,000 COLONIES/mL ESCHERICHIA COLI (A)  Final   Report Status 09/23/2018 FINAL  Final   Organism ID, Bacteria ESCHERICHIA COLI (A)  Final      Susceptibility   Escherichia coli - MIC*    AMPICILLIN 4 SENSITIVE Sensitive     CEFAZOLIN <=4  SENSITIVE Sensitive     CEFTRIAXONE <=1 SENSITIVE Sensitive     CIPROFLOXACIN <=0.25 SENSITIVE Sensitive     GENTAMICIN <=1 SENSITIVE Sensitive     IMIPENEM <=0.25 SENSITIVE Sensitive     NITROFURANTOIN <=16 SENSITIVE Sensitive     TRIMETH/SULFA <=20 SENSITIVE Sensitive     AMPICILLIN/SULBACTAM <=2 SENSITIVE Sensitive     PIP/TAZO <=4 SENSITIVE Sensitive     Extended ESBL NEGATIVE Sensitive     * >=100,000 COLONIES/mL ESCHERICHIA COLI    Radiology Studies: No results found. Scheduled Meds: . aspirin  81 mg Oral Daily  . atorvastatin  10 mg Oral Daily  . enoxaparin (LOVENOX) injection  60 mg Subcutaneous Q12H  . insulin aspart  0-9 Units Subcutaneous TID WC  . phosphorus  500 mg Oral BID   Continuous Infusions: . cefTRIAXone (ROCEPHIN)  IV 2 g (09/22/18 2348)  .  sodium bicarbonate  infusion 1000 mL 75 mL/hr at 09/23/18 1130    LOS: 1 day   Kerney Elbe, DO [facility] PAGER is on [place]  If 7PM-7AM, please contact night-coverage [email or web address] Password [facility] 09/23/2018, 1:07 PM

## 2018-09-23 NOTE — Progress Notes (Addendum)
ANTICOAGULATION CONSULT NOTE - Initial Consult  Pharmacy Consult for enoxaparin Indication: "elevating d-dimer"  No Known Allergies  Patient Measurements: Height: 5\' 5"  (165.1 cm) Weight: 126 lb 1.7 oz (57.2 kg) IBW/kg (Calculated) : 61.5  Vital Signs: Temp: 100.6 F (38.1 C) (04/11 0255) Temp Source: Oral (04/11 0255) BP: 116/53 (04/11 0243) Pulse Rate: 131 (04/11 0243)  Labs: Recent Labs    09/21/18 0323 09/21/18 1131 09/22/18 0330 09/23/18 0303  HGB 11.3* 10.7* 9.9* 10.2*  HCT 34.4* 31.6* 29.6* 30.2*  PLT 164 125* 79* 73*  LABPROT 13.5  --   --   --   INR 1.0  --   --   --   CREATININE 1.02 1.20 1.39* 1.14    Estimated Creatinine Clearance: 38.3 mL/min (by C-G formula based on SCr of 1.14 mg/dL).   Medical History: Past Medical History:  Diagnosis Date  . Anemia    Normocytic  . Cataracts, bilateral   . COPD (chronic obstructive pulmonary disease) ([place])   . Hyperlipidemia   . Internal hemorrhoids   . Prostate cancer ([place])   . Pulmonary emphysema ([place])    patient denies  . Tubulovillous adenoma of colon 2005  . Uncontrolled diabetes mellitus with microalbuminuria or microproteinuria     Assessment: Martin Bates is an 85yo male with E coli bacteremia and concern for COVID. D-dimer elevated on 4/9 at 3.88 now elevated to 5.17. Creatinine improved from 1/39 >> 1.14, with CrCl 38.67mL/min. Of note pltc 73.  Per discussion with Dr. Alfredia Ferguson, will start enoxaparin given his elevating d-dimer. This could be from his sepsis or from possible formation of microthrombi d/t COVID. MD will obtain daily d-dimers to trend and if COVID test is negative, can likely DC therapeutic anticoagulation. MD aware of current pltc 73, will trend.  Goal of Therapy:  Monitor platelets by anticoagulation protocol: Yes   Plan: Enoxaparin 60mg  SQ q12h Monitor d-dimer daily, renal function, COVID test, and CBC.  Thank you for involving pharmacy in this patient's care.  Janae Bridgeman,  PharmD PGY1 Pharmacy Resident Phone: [phone] 09/23/2018 8:51 AM

## 2018-09-23 NOTE — Progress Notes (Signed)
Pt shivering -states he is cold. Warm blankets provided and heat to thermostat adjusted.

## 2018-09-24 ENCOUNTER — Inpatient Hospital Stay (HOSPITAL_COMMUNITY): Payer: Self-pay

## 2018-09-24 ENCOUNTER — Inpatient Hospital Stay (HOSPITAL_COMMUNITY): Payer: Medicare Other

## 2018-09-24 DIAGNOSIS — J9601 Acute respiratory failure with hypoxia: Secondary | ICD-10-CM

## 2018-09-24 LAB — COMPREHENSIVE METABOLIC PANEL
ALT: 21 U/L (ref 0–44)
AST: 32 U/L (ref 15–41)
Albumin: 2.7 g/dL — ABNORMAL LOW (ref 3.5–5.0)
Alkaline Phosphatase: 89 U/L (ref 38–126)
Anion gap: 15 (ref 5–15)
BUN: 13 mg/dL (ref 8–23)
CO2: 23 mmol/L (ref 22–32)
Calcium: 8.5 mg/dL — ABNORMAL LOW (ref 8.9–10.3)
Chloride: 99 mmol/L (ref 98–111)
Creatinine, Ser: 0.82 mg/dL (ref 0.61–1.24)
GFR calc Af Amer: >60 mL/min (ref >60)
GFR calc non Af Amer: >60 mL/min (ref >60)
Glucose, Bld: 162 mg/dL — ABNORMAL HIGH (ref 70–99)
Potassium: 3.3 mmol/L — ABNORMAL LOW (ref 3.5–5.1)
Sodium: 137 mmol/L (ref 135–145)
Total Bilirubin: 0.6 mg/dL (ref 0.3–1.2)
Total Protein: 6 g/dL — ABNORMAL LOW (ref 6.5–8.1)

## 2018-09-24 LAB — CBC WITH DIFFERENTIAL/PLATELET
Abs Immature Granulocytes: 0.04 10*3/uL (ref 0.00–0.07)
Basophils Absolute: 0 10*3/uL (ref 0.0–0.1)
Basophils Relative: 0 %
Eosinophils Absolute: 0.1 10*3/uL (ref 0.0–0.5)
Eosinophils Relative: 2 %
HCT: 28.7 % — ABNORMAL LOW (ref 39.0–52.0)
Hemoglobin: 10.2 g/dL — ABNORMAL LOW (ref 13.0–17.0)
Immature Granulocytes: 0 %
Lymphocytes Relative: 11 %
Lymphs Abs: 1 10*3/uL (ref 0.7–4.0)
MCH: 31.3 pg (ref 26.0–34.0)
MCHC: 35.5 g/dL (ref 30.0–36.0)
MCV: 88 fL (ref 80.0–100.0)
Monocytes Absolute: 0.6 10*3/uL (ref 0.1–1.0)
Monocytes Relative: 7 %
Neutro Abs: 7.1 10*3/uL (ref 1.7–7.7)
Neutrophils Relative %: 80 %
Platelets: 87 10*3/uL — ABNORMAL LOW (ref 150–400)
RBC: 3.26 MIL/uL — ABNORMAL LOW (ref 4.22–5.81)
RDW: 11.2 % — ABNORMAL LOW (ref 11.5–15.5)
WBC: 8.9 10*3/uL (ref 4.0–10.5)
nRBC: 0 % (ref 0.0–0.2)

## 2018-09-24 LAB — IRON AND TIBC
Iron: 63 ug/dL (ref 45–182)
Saturation Ratios: 27 % (ref 17.9–39.5)
TIBC: 235 ug/dL — ABNORMAL LOW (ref 250–450)
UIBC: 172 ug/dL

## 2018-09-24 LAB — GLUCOSE, CAPILLARY
Glucose-Capillary: 175 mg/dL — ABNORMAL HIGH (ref 70–99)
Glucose-Capillary: 157 mg/dL — ABNORMAL HIGH (ref 70–99)
Glucose-Capillary: 130 mg/dL — ABNORMAL HIGH (ref 70–99)
Glucose-Capillary: 167 mg/dL — ABNORMAL HIGH (ref 70–99)

## 2018-09-24 LAB — C-REACTIVE PROTEIN: CRP: 11.9 mg/dL — ABNORMAL HIGH (ref <1.0)

## 2018-09-24 LAB — D-DIMER, QUANTITATIVE: D-Dimer, Quant: 1.83 ug/mL-FEU — ABNORMAL HIGH (ref 0.00–0.50)

## 2018-09-24 LAB — CK: Total CK: 207 U/L (ref 49–397)

## 2018-09-24 LAB — PHOSPHORUS: Phosphorus: 2.9 mg/dL (ref 2.5–4.6)

## 2018-09-24 LAB — MAGNESIUM: Magnesium: 1.7 mg/dL (ref 1.7–2.4)

## 2018-09-24 LAB — FIBRINOGEN: Fibrinogen: 730 mg/dL — ABNORMAL HIGH (ref 210–475)

## 2018-09-24 LAB — FERRITIN: Ferritin: 189 ng/mL (ref 24–336)

## 2018-09-24 LAB — VITAMIN B12: Vitamin B-12: 963 pg/mL — ABNORMAL HIGH (ref 180–914)

## 2018-09-24 LAB — RETICULOCYTES
Immature Retic Fract: 15.5 % (ref 2.3–15.9)
RBC.: 3.26 MIL/uL — ABNORMAL LOW (ref 4.22–5.81)
Retic Count, Absolute: 18.6 10*3/uL — ABNORMAL LOW (ref 19.0–186.0)
Retic Ct Pct: 0.6 % (ref 0.4–3.1)

## 2018-09-24 LAB — TROPONIN I
Troponin I: 0.48 ng/mL (ref <0.03)
Troponin I: 0.15 ng/mL (ref <0.03)
Troponin I: 0.1 ng/mL (ref <0.03)

## 2018-09-24 LAB — NOVEL CORONAVIRUS, NAA (HOSP ORDER, SEND-OUT TO REF LAB; TAT 18-24 HRS): SARS-CoV-2, NAA: NOT DETECTED

## 2018-09-24 LAB — FOLATE: Folate: 19.5 ng/mL (ref >5.9)

## 2018-09-24 MED ORDER — POTASSIUM CHLORIDE CRYS ER 20 MEQ PO TBCR
40.0000 meq | EXTENDED_RELEASE_TABLET | Freq: Two times a day (BID) | ORAL | Status: AC
  Administered 2018-09-24 (×2): 40 meq via ORAL
  Filled 2018-09-24 (×2): qty 2

## 2018-09-24 MED ORDER — MAGNESIUM SULFATE 2 GM/50ML IV SOLN
2.0000 g | Freq: Once | INTRAVENOUS | Status: AC
  Administered 2018-09-24: 2 g via INTRAVENOUS
  Filled 2018-09-24: qty 50

## 2018-09-24 NOTE — Progress Notes (Signed)
CRITICAL VALUE ALERT  Critical Value:  Troponin 0.48  Date & Time Notied:  09/24/2018 @0851   Provider Notified: Dr Alfredia Ferguson

## 2018-09-24 NOTE — Plan of Care (Signed)
  Problem: Coping: Goal: Level of anxiety will decrease Outcome: Progressing   Problem: Pain Managment: Goal: General experience of comfort will improve Outcome: Progressing   Problem: Safety: Goal: Ability to remain free from injury will improve Outcome: Progressing   Problem: Skin Integrity: Goal: Risk for impaired skin integrity will decrease Outcome: Progressing   

## 2018-09-24 NOTE — Progress Notes (Signed)
Pt transferred from 2W to the unit via wheelchair with staff In stable condition. Pt placed on telemetry and told to call when assistance is needed. Call bell is within reach and bed alarm is on. Will continue to monitor.

## 2018-09-24 NOTE — Progress Notes (Signed)
PROGRESS NOTE    [name]  IRJ:[id number] DOB: Dec 11, 1930 DOA: 09/21/2018 PCP: Deland Pretty, MD   Brief Narrative:  HPI per Dr. Dessa Phi on 09/21/2018  Martin Bates is a 85 y.o. male with medical history significant of COPD, diabetes who presents with 2-day history of fever, chills and a dry cough.  He has been isolating at home with his wife, denies any known COVID-19 exposure.  He denies any shortness of breath, chest pain, nausea, vomiting, abdominal pain or diarrhea, dizziness or lightheadedness.  History gathered by myself using [place], through phone in patient's room.  ED Course: Lab obtained which revealed WBC 1.1, lymphocyte count 0.4.  Chest x-ray revealed mild left basilar subsegmental atelectasis without other active cardiopulmonary disease.  Influenza and respiratory panel negative.  He has not required any supplemental oxygen, however patient was referred for admission due to his low blood pressure in the 90s.  Patient remained asymptomatic as far as low blood pressure.  EDP felt patient was at high risk of decompensation due to his age and medical comorbidities including COPD and diabetes.  **His respiratory status improved significantly and he defervesced after given acetaminophen but spiked another temperature overnight.  His urine culture grew out E. coli and blood cultures also 2/2 showed E. coli.  Likely source is the urine and will obtain a scan and renal ultrasound now that patient is COVID Negative. Patient's Hospitalization has been complicated by an AKI as well as low electrolytes which have been replete.  Patient was started back on gentle IV fluid hydration and this was changed to Sodium Bicarbonate which is now stopped.  Because of his respiratory status being stable he was transferred back to the progressive care unit and out of the ICU. Overnight 4/10/-4/11 he became Tachycardic, Tachypenic and Febrile and had to be placed back on 2 Liters of O2 but he is  improved and off of O2 now.   This AM he appears well and is improved and Hoping to go home soon. Had some diarrhea so will check GI Pathogen Panel. Awaiting LE duplex to see if Full Dose Lovenox can be stopped and also awaiting Renal U/S. Patient stable to be transferred to Medical Telemetry.   Assessment & Plan:   Principal Problem:   Suspected Covid-19 Virus Infection Active Problems:   DM type 2 (diabetes mellitus, type 2) (HCC)   Lactic acid acidosis   CAD (coronary artery disease)   Hyperlipidemia  Sepsis 2/2 E Coli Bacteremia and associated UTI -Was febrile, tachypneic, tachycardic, leukopenic, and hypotensive on admission with lactic acidosis and now source of infection with a urinary tract infection and E. coli bacteremia -Sepsis physiology is improving but he became febrile again this AM and wasa tachycardic and mildly tachypneic -Restarted IV fluid maintenance at 75 mL's per hour for 1 day and changed to Sodium Bicarbonate 150 mEQ at 75 mL/hr -Procalcitonin level was 17.07 on admission lactic acidosis was 2.2 and improved to 1.6; Repeat Procalcitonin Level in AM -Urinalysis showed clear urine with yellow color, negative nitrites, negative ketones, many bacteria, 0-5 squamous epithelial cells, 21-50 WBCs, small leukocytes -Urine culture showed rater than 100,000 CFU of E Coli with Sensitivities pending -Cultures x2 showed gram-negative rods and initial culture shows Enterobacteriaceae species with E. Coli; Blood culture sensitivities are pansensitive -Continue Empiric Abx with IV Ceftriaxone 2 Grams and therapy as indicated from sensitivities and will ned to change to po At D/C  -Repeated Blood Cx 09/23/2018 showed NGTD  at <24 Hours  -WBC went from 1.1 -> 3.4 -> 7.9 -> 10.2 -> 8.9 -Had some Diarrhea so will check GI Pathogen Panel to see if He had E Coli pathogens and continue Enteric Precautions for now  -We will cultures and continue to monitor patient's clinical status -Will  Order PT/OT to Evaluate and Treat   COVID-19 Ruled out; Sepsis was from E Coli Bacteremia -Patient presented with fever 102, lymphopenia -No acute cardiopulmonary disease on chest x-ray -Influenza and respiratory viral panel are negative -COVID-19 test Negative and was placed on Airborne and enteric precautions but will D/C Enteric Precautions  -Labs were orderd LDH was 126, ferritin level 118, CRP was 11.2, procalcitonin level was high at 17.07 (suggesting Bacterial infection), ESR was 53 and d-dimer was 3.88 -Repeat Labs this AM showed CK of 493, LDH of 199, Ferritin of 226, and D-Dimer of 5.17 -Repeat Labs showed Fibrinogen of 730, D-Dimer of 1.83, CRP of 11.9, Ferritin of 189, and CK Of 207 -Started Full Dose AC with Lovenox given concern for Microthrombi given uptrending D-Dimer; Will trend D-Dimers Daily and now trending down and is 1.83; Will obtain LE Dopplers and if Negative will D/C Anticoagulation  -Patient remained Lymphopenic and his Lymphocyte Count was 200 -> 600 -> and now 1000  Lactic Acidosis -Improved after IV fluid -Acid level went from 2.2 is now 1.6 -We will continue with IV fluid hydration with normal saline at 75 mL's per hour for 1 day and changed to Sodium Bicarbonate 150 mEQ at 75 mL/hr which is now stopped   CAD -Continue Aspirin 81 mg po Daily and Atorvastatin 10 mg po qDaily for now -Troponin was 0.48 but in the setting of Sepsis and Demand Ischemia; Patient is not complaining of any CP  Hyperlipidemia -Continue Atorvastatin 10 mg po qHS -Currently Omega-3 Fatty Acids 1000 mg po Capsules held  Type 2 Diabetes -Continue to Hold Home Metformin 1000 mg in AM and 500 mg in pm -Started Sensitive Novolog SSI AC -CBG's ranging from 62-175 -No HbA1c on File so will check in AM  -Continue to Monitor CBG's closely and adjust Insulin Regimen as Necessary  Hypokalemia -Patient's potassium level this Morning was 3.3 in the setting of loose stools -Replete with  po KCl 40 mEQ BID x2 Doses -Magnesium level now 1.7; Replete with IV Mag Sulfate 2 grams x2 -Continue monitor replete as necessary -Repeat CMP in a.m.  Hypomagnesemia -Patient magnesium level was 1.7 -Replete with IV mag sulfate 2 g yesterday -Continue monitor replete as necessary -Repeat magnesium level in a.m.  Hypophosphatemia -Patient's Phos Level was 2.9 this AM -Replete with po KPhos Neutral 500 mg po BID -Continue to Monitor and Replete as Necessary -Repeat Phos Level in the AM   Acute Kidney Injury, improving  -The setting of sepsis and Loose stools -Patient's BUN/creatinine went from 19/1.02 worsened to 25/1.39; BUN/Cr is trending down and has gone from 24/1.14 -> 24/1.04 -> 13/0.82 -Had Worsened in the setting of sepsis and will check for urinary retention -Renal ultrasound ordered for further evaluation -IV fluid hydration had been restarted at 75 mL's per hour but changed to Sodium Bicarbonate and now stopped -Strict I's and O's  Normocytic Anemia -Patient's hemoglobin/hematocrit went from 11.3/34.4 and trended down to 10.7/31.6 and is now 9.9/21.6 -> 10.2/30.2 -> 10.2/28.7 -Again the setting of dilutional drop from IV fluid resuscitation -Checked Anemia Panel and showed iron level of 63, U IBC of 172, TIBC of 235, saturation ratios of 27%, ferritin   level 189, folate level of 19.5, vitamin B12 level of 963 -Continue monitor for signs and symptoms of bleeding -Repeat CBC in AM  Thrombocytopenia -In the setting of sepsis and E. coli bacteremia and infection -Platelet count went from 125 -> 79 -> 73 -> 87,000 -Continue to monitor for signs of bleeding now that he is being started on FULL DOSE AC -Continue to Monitor and Trend CBC   Non-Gap Metabolic Acidosis -Patient's CO2 was 15 and AG was 14, Now Improved as CO2 is 23 and AG is 15  -Started Patient on Sodium Bicarbonate 150 mEQ in D5W at 75 mL/hr and now stopped -Continue to Monitor and Trend -Repeat CMP in AM    Elevated Troponin  -In the setting of sepsis and demand ischemia as patient did not complain of any chest pain -Troponin was 0.48 -Continue monitor and trend troponin  DVT prophylaxis: Enoxaparin 40 mg sq q24h changed to 1 mg/kg BID  Code Status: FULL CODE Family Communication: Discussed with wife over the phone Disposition Plan: Transfer to Medical Telemetry now that COVID-19 Disease is ruled out   Consultants:   PCCM/Pulmonary    Procedures:  None   Antimicrobials:  Anti-infectives (From admission, onward)   Start     Dose/Rate Route Frequency Ordered Stop   09/21/18 2200  cefTRIAXone (ROCEPHIN) 2 g in sodium chloride 0.9 % 100 mL IVPB     2 g 200 mL/hr over 30 Minutes Intravenous Daily at bedtime 09/21/18 1754     09/21/18 1800  cefTRIAXone (ROCEPHIN) 2 g in sodium chloride 0.9 % 100 mL IVPB  Status:  Discontinued     2 g 200 mL/hr over 30 Minutes Intravenous Daily at bedtime 09/21/18 1753 09/21/18 1754   09/21/18 0545  cefTRIAXone (ROCEPHIN) 2 g in sodium chloride 0.9 % 100 mL IVPB  Status:  Discontinued     2 g 200 mL/hr over 30 Minutes Intravenous Every 24 hours 09/21/18 0532 09/21/18 1031   09/21/18 0545  azithromycin (ZITHROMAX) 500 mg in sodium chloride 0.9 % 250 mL IVPB  Status:  Discontinued     500 mg 250 mL/hr over 60 Minutes Intravenous Every 24 hours 09/21/18 0532 09/21/18 1031     Subjective: Seen and examined at bedside had some diarrhea earlier feels well and denies any shortness of breath, lightheadedness or dizziness.  No other concerns or complaints at this time and hoping to go home the next few days.  Objective: Vitals:   09/23/18 2300 09/23/18 2333 09/24/18 0446 09/24/18 0828  BP: 138/79 131/81 134/68 132/86  Pulse:  86 92 83  Resp:  _0 Temp:  98.7 F (37.1 C) 98.5 F (36.9 C) 98.4 F (36.9 C)  TempSrc:  Oral Oral Oral  SpO2:  96% 95% 96%  Weight:      Height:        Intake/Output Summary (Last 24 hours) at 09/24/2018 1233 Last  data filed at 09/24/2018 0800 Gross per 24 hour  Intake 1730 ml  Output 1475 ml  Net 255 ml   Filed Weights   09/21/18 0930 09/21/18 2014 09/22/18 2319  Weight: 59.3 kg 58.6 kg 57.2 kg   Examination: Physical Exam:  Constitutional: Nourished, well-developed thin [place] male currently no acute distress appears calm and pleasant Eyes: Conjunctive are normal.  Sclera anicteric. ENMT: External ears and nose appear normal.  Grossly normal hearing. Neck: Supple no JVD Respiratory: Clear to auscultation bilaterally no appreciable wheezing, rales, rhonchi.  Patient not tachypneic wheezing  any accessory muscles to breathe and had unlabored breathing. Cardiovascular: Regular rate and rhythm.  No appreciable murmurs, rubs, gallops.  No extremity edema noted. Abdomen: Soft, mildly tender, nondistended.  Bowel sounds present GU: Deferred Musculoskeletal: No clubbing or cyanosis.  No joint deformities in the upper and lower extremities. Skin: Is warm and dry with no appreciable rashes or lesions on limited skin evaluation. Neurologic: Cranial nerves II through XII grossly intact no appreciable focal deficits.  Romberg sign and cerebellar reflexes were not assessed Psychiatric:  Intact judgment and insight.  Patient is awake, alert and oriented x3.  Has a pleasant mood and affect  Data Reviewed: I have personally reviewed following labs and imaging studies  CBC: Recent Labs  Lab 09/21/18 0323 09/21/18 1131 09/22/18 0330 09/23/18 0303 09/24/18 0609  WBC 1.1* 3.4* 7.9 10.2 8.9  NEUTROABS 0.7*  --  7.2 8.9* 7.1  HGB 11.3* 10.7* 9.9* 10.2* 10.2*  HCT 34.4* 31.6* 29.6* 30.2* 28.7*  MCV 92.2 92.4 91.1 90.1 88.0  PLT 164 125* 79* 73* 87*   Basic Metabolic Panel: Recent Labs  Lab 09/21/18 0323 09/21/18 1131 09/22/18 0330 09/23/18 0303 09/23/18 0831 09/24/18 0609  NA 136  --  137 136 138 137  K 4.1  --  2.9* 4.7 4.5 3.3*  CL 101  --  103 107 109 99  CO2 22  --  20* 15* 17* 23  GLUCOSE  180*  --  93 139* 115* 162*  BUN 19  --  25* 24* 24* 13  CREATININE 1.02 1.20 1.39* 1.14 1.04 0.82  CALCIUM 9.7  --  8.7* 8.8* 8.7* 8.5*  MG  --   --  1.4* 2.2  --  1.7  PHOS  --   --   --  1.6*  --  2.9   GFR: Estimated Creatinine Clearance: 53.3 mL/min (by C-G formula based on SCr of 0.82 mg/dL). Liver Function Tests: Recent Labs  Lab 09/21/18 0323 09/23/18 0303 09/23/18 0831 09/24/18 0609  AST 16 53* 42* 32  ALT 12 23 21 21  ALKPHOS 65 110 77 89  BILITOT 1.2 1.0 0.6 0.6  PROT 7.3 6.2* 5.6* 6.0*  ALBUMIN 3.7 2.9* 2.6* 2.7*   No results for input(s): LIPASE, AMYLASE in the last 168 hours. No results for input(s): AMMONIA in the last 168 hours. Coagulation Profile: Recent Labs  Lab 09/21/18 0323  INR 1.0   Cardiac Enzymes: Recent Labs  Lab 09/23/18 0905 09/24/18 0609  CKTOTAL 493* 207  TROPONINI  --  0.48*   BNP (last 3 results) No results for input(s): PROBNP in the last 8760 hours. HbA1C: No results for input(s): HGBA1C in the last 72 hours. CBG: Recent Labs  Lab 09/22/18 2218 09/23/18 1124 09/23/18 1746 09/24/18 0827 09/24/18 1127  GLUCAP 105* 147* 154* 175* 157*   Lipid Profile: No results for input(s): CHOL, HDL, LDLCALC, TRIG, CHOLHDL, LDLDIRECT in the last 72 hours. Thyroid Function Tests: No results for input(s): TSH, T4TOTAL, FREET4, T3FREE, THYROIDAB in the last 72 hours. Anemia Panel: Recent Labs    09/23/18 0303 09/24/18 0609  VITAMINB12  --  963*  FOLATE  --  19.5  FERRITIN 226 189  TIBC  --  235*  IRON  --  63  RETICCTPCT  --  0.6   Sepsis Labs: Recent Labs  Lab 09/21/18 0323 09/21/18 0522 09/21/18 1131  PROCALCITON  --   --  17.07  LATICACIDVEN 2.2* 1.6  --     Recent Results (from   the past 240 hour(s))  Culture, blood (Routine x 2)     Status: Abnormal   Collection Time: 09/21/18  3:00 AM  Result Value Ref Range Status   Specimen Description BLOOD RIGHT ARM  Final   Special Requests   Final    BOTTLES DRAWN AEROBIC  AND ANAEROBIC Blood Culture results may not be optimal due to an excessive volume of blood received in culture bottles   Culture  Setup Time   Final    GRAM NEGATIVE RODS ANAEROBIC BOTTLE ONLY CRITICAL VALUE NOTED.  VALUE IS CONSISTENT WITH PREVIOUSLY REPORTED AND CALLED VALUE.    Culture (A)  Final    ESCHERICHIA COLI SUSCEPTIBILITIES PERFORMED ON PREVIOUS CULTURE WITHIN THE LAST 5 DAYS. Performed at [facility], [place] [address], [place], [place] [address]    Report Status 09/23/2018 FINAL  Final  Culture, blood (Routine x 2)     Status: Abnormal   Collection Time: 09/21/18  3:10 AM  Result Value Ref Range Status   Specimen Description BLOOD LEFT ARM  Final   Special Requests   Final    BOTTLES DRAWN AEROBIC AND ANAEROBIC Blood Culture adequate volume   Culture  Setup Time   Final    GRAM NEGATIVE RODS IN BOTH AEROBIC AND ANAEROBIC BOTTLES CRITICAL RESULT CALLED TO, READ BACK BY AND VERIFIED WITHAsencion Islam [facility] 09/21/18 1740 JDW Performed at [facility], [place] [address] [place], [place] [address]    Culture ESCHERICHIA COLI (A)  Final   Report Status 09/23/2018 FINAL  Final   Organism ID, Bacteria ESCHERICHIA COLI  Final      Susceptibility   Escherichia coli - MIC*    AMPICILLIN 4 SENSITIVE Sensitive     CEFAZOLIN <=4 SENSITIVE Sensitive     CEFEPIME <=1 SENSITIVE Sensitive     CEFTAZIDIME <=1 SENSITIVE Sensitive     CEFTRIAXONE <=1 SENSITIVE Sensitive     CIPROFLOXACIN <=0.25 SENSITIVE Sensitive     GENTAMICIN <=1 SENSITIVE Sensitive     IMIPENEM <=0.25 SENSITIVE Sensitive     TRIMETH/SULFA <=20 SENSITIVE Sensitive     AMPICILLIN/SULBACTAM <=2 SENSITIVE Sensitive     PIP/TAZO <=4 SENSITIVE Sensitive     Extended ESBL NEGATIVE Sensitive     * ESCHERICHIA COLI  Blood Culture ID Panel (Reflexed)     Status: Abnormal   Collection Time: 09/21/18  3:10 AM  Result Value Ref Range Status   Enterococcus species NOT DETECTED NOT DETECTED Final   Listeria  monocytogenes NOT DETECTED NOT DETECTED Final   Staphylococcus species NOT DETECTED NOT DETECTED Final   Staphylococcus aureus (BCID) NOT DETECTED NOT DETECTED Final   Streptococcus species NOT DETECTED NOT DETECTED Final   Streptococcus agalactiae NOT DETECTED NOT DETECTED Final   Streptococcus pneumoniae NOT DETECTED NOT DETECTED Final   Streptococcus pyogenes NOT DETECTED NOT DETECTED Final   Acinetobacter baumannii NOT DETECTED NOT DETECTED Final   Enterobacteriaceae species DETECTED (A) NOT DETECTED Final    Comment: Enterobacteriaceae represent a large family of gram-negative bacteria, not a single organism. CRITICAL RESULT CALLED TO, READ BACK BY AND VERIFIED WITH: B MANCHERIL PHARMD 09/21/18 1740 JDW    Enterobacter cloacae complex NOT DETECTED NOT DETECTED Final   Escherichia coli DETECTED (A) NOT DETECTED Final    Comment: CRITICAL RESULT CALLED TO, READ BACK BY AND VERIFIED WITH: B MANCHERIL PHARMD 09/21/18 1740 JDW    Klebsiella oxytoca NOT DETECTED NOT DETECTED Final   Klebsiella pneumoniae NOT DETECTED NOT DETECTED  Final   Proteus species NOT DETECTED NOT DETECTED Final   Serratia marcescens NOT DETECTED NOT DETECTED Final   Carbapenem resistance NOT DETECTED NOT DETECTED Final   Haemophilus influenzae NOT DETECTED NOT DETECTED Final   Neisseria meningitidis NOT DETECTED NOT DETECTED Final   Pseudomonas aeruginosa NOT DETECTED NOT DETECTED Final   Candida albicans NOT DETECTED NOT DETECTED Final   Candida glabrata NOT DETECTED NOT DETECTED Final   Candida krusei NOT DETECTED NOT DETECTED Final   Candida parapsilosis NOT DETECTED NOT DETECTED Final   Candida tropicalis NOT DETECTED NOT DETECTED Final    Comment: Performed at [facility], [address] [place], [place] [address]  Respiratory Panel by PCR     Status: None   Collection Time: 09/21/18  3:35 AM  Result Value Ref Range Status   Adenovirus NOT DETECTED NOT DETECTED Final   Coronavirus 229E NOT DETECTED  NOT DETECTED Final    Comment: (NOTE) The Coronavirus on the Respiratory Panel, DOES NOT test for the novel  Coronavirus (2019 nCoV)    Coronavirus HKU1 NOT DETECTED NOT DETECTED Final   Coronavirus NL63 NOT DETECTED NOT DETECTED Final   Coronavirus OC43 NOT DETECTED NOT DETECTED Final   Metapneumovirus NOT DETECTED NOT DETECTED Final   Rhinovirus / Enterovirus NOT DETECTED NOT DETECTED Final   Influenza A NOT DETECTED NOT DETECTED Final   Influenza B NOT DETECTED NOT DETECTED Final   Parainfluenza Virus 1 NOT DETECTED NOT DETECTED Final   Parainfluenza Virus 2 NOT DETECTED NOT DETECTED Final   Parainfluenza Virus 3 NOT DETECTED NOT DETECTED Final   Parainfluenza Virus 4 NOT DETECTED NOT DETECTED Final   Respiratory Syncytial Virus NOT DETECTED NOT DETECTED Final   Bordetella pertussis NOT DETECTED NOT DETECTED Final   Chlamydophila pneumoniae NOT DETECTED NOT DETECTED Final   Mycoplasma pneumoniae NOT DETECTED NOT DETECTED Final    Comment: Performed at [facility], [address] [place], [place] [address]  Novel Coronavirus, NAA (hospital order; send-out to ref lab)     Status: None   Collection Time: 09/21/18  3:35 AM  Result Value Ref Range Status   SARS-CoV-2, NAA NOT DETECTED NOT DETECTED Final    Comment: Negative (Not Detected) results do not exclude infection caused by SARS CoV 2 and should not be used as the sole basis for treatment or other patient management decisions. Optimum specimen types and timing for peak viral levels during infections caused  by SARS CoV 2 have not been determined. Collection of multiple specimens (types and time points) from the same patient may be necessary to detect the virus. Improper specimen collection and handling, sequence variability underlying assay primers and or probes, or the presence of organisms in  quantities less than the limit of detection of the assay may lead to false negative results. Positive and negative predictive  values of testing are highly dependent on prevalence. False negative results are more likely when prevalence of disease is high. (NOTE) The expected result is Negative (Not Detected). The SARS CoV 2 test is intended for the presumptive qualitative  detection of nucleic acid from SARS CoV 2 in upper and lower  respir atory specimens. Testing methodology is real time RT PCR. Test results must be correlated with clinical presentation and  evaluated in the context of other laboratory and epidemiologic data.  Test performance can be affected because the epidemiology and  clinical spectrum of infection caused by SARS CoV 2 is not fully  known.   For example, the optimum types of specimens to collect and  when during the course of infection these specimens are most likely  to contain detectable viral RNA may not be known. This test has not been Food and Drug Administration (FDA) cleared or  approved and has been authorized by FDA under an Emergency Use  Authorization (EUA). The test is only authorized for the duration of  the declaration that circumstances exist justifying the authorization  of emergency use of in vitro diagnostic tests for detection and or  diagnosis of SARS CoV 2 under Section 564(b)(1) of the Act, 21 U.S.C.  section 360bbb 3(b)(1), unless the authorization is terminated or   revoked sooner. [facility] is certified under the  Clinical Laboratory Improvement Amendments of 1988 (CLIA), 42 U.S.C.  section 263a, to perform high complexity tests. Performed at [facility] CLIA [id number] [address], [address], [place] [address] Laboratory Director: Joseph H. Willman, MD Fact Sheet for Healthcare Providers  https://www.fda.gov/media/136313/download Fact Sheet for Patients  https://www.fda.gov/media/136312/download    Coronavirus Source NASOPHARYNGEAL  Final    Comment: Performed at [facility], [address]  [place] , [place] [address]  Urine culture     Status: Abnormal   Collection Time: 09/21/18  6:57 AM  Result Value Ref Range Status   Specimen Description URINE, CLEAN CATCH  Final   Special Requests   Final    NONE Performed at [facility], [address] [place], [place] [address]    Culture >=100,000 COLONIES/mL ESCHERICHIA COLI (A)  Final   Report Status 09/23/2018 FINAL  Final   Organism ID, Bacteria ESCHERICHIA COLI (A)  Final      Susceptibility   Escherichia coli - MIC*    AMPICILLIN 4 SENSITIVE Sensitive     CEFAZOLIN <=4 SENSITIVE Sensitive     CEFTRIAXONE <=1 SENSITIVE Sensitive     CIPROFLOXACIN <=0.25 SENSITIVE Sensitive     GENTAMICIN <=1 SENSITIVE Sensitive     IMIPENEM <=0.25 SENSITIVE Sensitive     NITROFURANTOIN <=16 SENSITIVE Sensitive     TRIMETH/SULFA <=20 SENSITIVE Sensitive     AMPICILLIN/SULBACTAM <=2 SENSITIVE Sensitive     PIP/TAZO <=4 SENSITIVE Sensitive     Extended ESBL NEGATIVE Sensitive     * >=100,000 COLONIES/mL ESCHERICHIA COLI  Culture, blood (routine x 2)     Status: None (Preliminary result)   Collection Time: 09/23/18  8:25 AM  Result Value Ref Range Status   Specimen Description BLOOD RIGHT ANTECUBITAL  Final   Special Requests AEROBIC BOTTLE ONLY Blood Culture adequate volume  Final   Culture   Final    NO GROWTH < 24 HOURS Performed at [facility], [address] [place], [place] [address]    Report Status PENDING  Incomplete  Culture, blood (routine x 2)     Status: None (Preliminary result)   Collection Time: 09/23/18  8:30 AM  Result Value Ref Range Status   Specimen Description BLOOD LEFT HAND  Final   Special Requests AEROBIC BOTTLE ONLY Blood Culture adequate volume  Final   Culture   Final    NO GROWTH < 24 HOURS Performed at [facility], [address] [place], [place] [address]    Report Status PENDING  Incomplete    Radiology Studies: No results found. Scheduled Meds: . aspirin  81 mg Oral Daily  .  atorvastatin  10 mg Oral Daily  . enoxaparin (LOVENOX)   injection  60 mg Subcutaneous Q12H  . insulin aspart  0-9 Units Subcutaneous TID WC  . potassium chloride  40 mEq Oral BID   Continuous Infusions: . cefTRIAXone (ROCEPHIN)  IV 2 g (09/23/18 2257)    LOS: 2 days   Kerney Elbe, DO [facility] PAGER is on [place]  If 7PM-7AM, please contact night-coverage [email or web address] Password [facility] 09/24/2018, 12:33 PM

## 2018-09-25 ENCOUNTER — Inpatient Hospital Stay (HOSPITAL_COMMUNITY): Payer: Medicare Other

## 2018-09-25 DIAGNOSIS — I2510 Atherosclerotic heart disease of native coronary artery without angina pectoris: Secondary | ICD-10-CM

## 2018-09-25 DIAGNOSIS — R7989 Other specified abnormal findings of blood chemistry: Secondary | ICD-10-CM

## 2018-09-25 DIAGNOSIS — A419 Sepsis, unspecified organism: Secondary | ICD-10-CM

## 2018-09-25 LAB — COMPREHENSIVE METABOLIC PANEL
ALT: 23 U/L (ref 0–44)
AST: 30 U/L (ref 15–41)
Albumin: 2.8 g/dL — ABNORMAL LOW (ref 3.5–5.0)
Alkaline Phosphatase: 84 U/L (ref 38–126)
Anion gap: 13 (ref 5–15)
BUN: 10 mg/dL (ref 8–23)
CO2: 21 mmol/L — ABNORMAL LOW (ref 22–32)
Calcium: 8.7 mg/dL — ABNORMAL LOW (ref 8.9–10.3)
Chloride: 102 mmol/L (ref 98–111)
Creatinine, Ser: 0.77 mg/dL (ref 0.61–1.24)
GFR calc Af Amer: >60 mL/min (ref >60)
GFR calc non Af Amer: >60 mL/min (ref >60)
Glucose, Bld: 148 mg/dL — ABNORMAL HIGH (ref 70–99)
Potassium: 4.1 mmol/L (ref 3.5–5.1)
Sodium: 136 mmol/L (ref 135–145)
Total Bilirubin: 0.5 mg/dL (ref 0.3–1.2)
Total Protein: 6.1 g/dL — ABNORMAL LOW (ref 6.5–8.1)

## 2018-09-25 LAB — CBC WITH DIFFERENTIAL/PLATELET
Abs Immature Granulocytes: 0.02 10*3/uL (ref 0.00–0.07)
Basophils Absolute: 0 10*3/uL (ref 0.0–0.1)
Basophils Relative: 0 %
Eosinophils Absolute: 0.2 10*3/uL (ref 0.0–0.5)
Eosinophils Relative: 3 %
HCT: 29.2 % — ABNORMAL LOW (ref 39.0–52.0)
Hemoglobin: 10.2 g/dL — ABNORMAL LOW (ref 13.0–17.0)
Immature Granulocytes: 0 %
Lymphocytes Relative: 20 %
Lymphs Abs: 1.3 10*3/uL (ref 0.7–4.0)
MCH: 30.6 pg (ref 26.0–34.0)
MCHC: 34.9 g/dL (ref 30.0–36.0)
MCV: 87.7 fL (ref 80.0–100.0)
Monocytes Absolute: 0.6 10*3/uL (ref 0.1–1.0)
Monocytes Relative: 9 %
Neutro Abs: 4.4 10*3/uL (ref 1.7–7.7)
Neutrophils Relative %: 68 %
Platelets: 99 10*3/uL — ABNORMAL LOW (ref 150–400)
RBC: 3.33 MIL/uL — ABNORMAL LOW (ref 4.22–5.81)
RDW: 11.2 % — ABNORMAL LOW (ref 11.5–15.5)
WBC: 6.6 10*3/uL (ref 4.0–10.5)
nRBC: 0 % (ref 0.0–0.2)

## 2018-09-25 LAB — HEMOGLOBIN A1C
Hgb A1c MFr Bld: 6.1 % — ABNORMAL HIGH (ref 4.8–5.6)
Hgb A1c MFr Bld: 6.1 % — ABNORMAL HIGH (ref 4.8–5.6)
Mean Plasma Glucose: 128 mg/dL
Mean Plasma Glucose: 128 mg/dL

## 2018-09-25 LAB — TROPONIN I: Troponin I: 0.07 ng/mL (ref <0.03)

## 2018-09-25 LAB — GLUCOSE, CAPILLARY: Glucose-Capillary: 149 mg/dL — ABNORMAL HIGH (ref 70–99)

## 2018-09-25 LAB — MAGNESIUM: Magnesium: 2 mg/dL (ref 1.7–2.4)

## 2018-09-25 LAB — PHOSPHORUS: Phosphorus: 3.2 mg/dL (ref 2.5–4.6)

## 2018-09-25 MED ORDER — LOPERAMIDE HCL 2 MG PO CAPS: 2.0000 mg | ORAL_CAPSULE | ORAL | 0 refills | Status: AC | PRN

## 2018-09-25 MED ORDER — CEPHALEXIN 500 MG PO CAPS: 500.0000 mg | ORAL_CAPSULE | Freq: Three times a day (TID) | ORAL | 0 refills | Status: AC

## 2018-09-25 MED ORDER — CEPHALEXIN 500 MG PO CAPS
500.0000 mg | ORAL_CAPSULE | Freq: Three times a day (TID) | ORAL | Status: DC
  Administered 2018-09-25: 500 mg via ORAL
  Filled 2018-09-25: qty 1

## 2018-09-25 MED ORDER — ONDANSETRON HCL 4 MG PO TABS: 4.0000 mg | ORAL_TABLET | Freq: Four times a day (QID) | ORAL | 0 refills | Status: DC | PRN

## 2018-09-25 NOTE — Evaluation (Signed)
Physical Therapy Evaluation Patient Details Name: Martin Bates MRN: [id number] DOB: 08-09-1930 Today's Date: 09/25/2018   History of Present Illness  Pt is an 85 year old man admitted 09/22/18 with fever, cough and chills. Hospital course complicated by tachycardia and hypoxia, transferred to ICU. Pt found to have sepsis 2/2 ecoli bacteremia and UTI, AKI. COVID negative. PMH: CAD, COPD, DM.  Clinical Impression  Pt admitted secondary to problem above with deficits below. Pt with mild unsteadiness during gait and with 1 mild LOB; pt able to self recover. Required supervision for safety. Anticipate pt will progress well. Will continue to follow acutely to maximize functional mobility independence and safety.     Follow Up Recommendations Home health PT;Supervision for mobility/OOB(Safety Eval)    Equipment Recommendations  None recommended by PT    Recommendations for Other Services       Precautions / Restrictions Precautions Precautions: Fall Restrictions Weight Bearing Restrictions: No      Mobility  Bed Mobility Overal bed mobility: Modified Independent             General bed mobility comments: HOB up  Transfers Overall transfer level: Independent Equipment used: None                Ambulation/Gait Ambulation/Gait assistance: Supervision Gait Distance (Feet): 100 Feet Assistive device: None Gait Pattern/deviations: Step-through pattern;Decreased stride length;Drifts right/left Gait velocity: Decreased    General Gait Details: Mild unsteadiness noted, with 1 mild LOB, however, pt able to self recover. Supervision for safety during ambulation.   Stairs            Wheelchair Mobility    Modified Rankin (Stroke Patients Only)       Balance Overall balance assessment: Mild deficits observed, not formally tested                                           Pertinent Vitals/Pain Pain Assessment: No/denies pain    Home Living  Family/patient expects to be discharged to:: Private residence Living Arrangements: Spouse/significant other Available Help at Discharge: Family;Available 24 hours/day Type of Home: Apartment(4 plex) Home Access: Level entry     Home Layout: One level Home Equipment: None      Prior Function Level of Independence: Independent         Comments: drives, goes to the gym     Hand Dominance   Dominant Hand: Right    Extremity/Trunk Assessment   Upper Extremity Assessment Upper Extremity Assessment: Defer to OT evaluation    Lower Extremity Assessment Lower Extremity Assessment: Generalized weakness       Communication   Communication: Prefers language other than [place];No difficulties;Other (comment)([place]orean; understands/speaks english fairly well )  Cognition Arousal/Alertness: Awake/alert Behavior During Therapy: WFL for tasks assessed/performed Overall Cognitive Status: Within Functional Limits for tasks assessed                                        General Comments      Exercises     Assessment/Plan    PT Assessment Patient needs continued PT services  PT Problem List Decreased strength;Decreased balance;Decreased mobility       PT Treatment Interventions Gait training;Functional mobility training;Therapeutic activities;Therapeutic exercise;Balance training;Patient/family education    PT Goals (Current goals can be found in  the Care Plan section)  Acute Rehab PT Goals Patient Stated Goal: to go home today PT Goal Formulation: With patient Time For Goal Achievement: 10/09/18 Potential to Achieve Goals: Good    Frequency Min 3X/week   Barriers to discharge        Co-evaluation               AM-PAC PT "6 Clicks" Mobility  Outcome Measure Help needed turning from your back to your side while in a flat bed without using bedrails?: None Help needed moving from lying on your back to sitting on the side of a flat bed without  using bedrails?: None Help needed moving to and from a bed to a chair (including a wheelchair)?: None Help needed standing up from a chair using your arms (e.g., wheelchair or bedside chair)?: None Help needed to walk in hospital room?: A Little Help needed climbing 3-5 steps with a railing? : A Little 6 Click Score: 22    End of Session   Activity Tolerance: Patient tolerated treatment well Patient left: in bed;with call bell/phone within reach Nurse Communication: Mobility status PT Visit Diagnosis: Other abnormalities of gait and mobility (R26.89);Unsteadiness on feet (R26.81)    Time: 5183-3582 PT Time Calculation (min) (ACUTE ONLY): 10 min   Charges:   PT Evaluation $PT Eval Low Complexity: [place], PT, DPT  Acute Rehabilitation Services  Pager: [phone] Office: [phone]   Rudean Hitt 09/25/2018, 11:30 AM

## 2018-09-25 NOTE — Evaluation (Signed)
Occupational Therapy Evaluation and Discharge Patient Details Name: Martin Bates MRN: [id number] DOB: [birth year] Today's Date: 09/25/2018    History of Present Illness Pt is an 85 year old man admitted 09/22/18 with cough, fever and chills. Hospital course complicated by tachycardia and hypoxia requiring 02 and transferred to ICU, found to have sepsis 2/2 ecoli bacteremia and UTI, AKI. PMH: CAD, COPD, DM.   Clinical Impression   Pt is an active, independent man. He presents with generalized weakness and mild unsteadiness with ambulation, requiring supervision for safety. Pt has 24 hour care of his wife at home. No further OT needs.    Follow Up Recommendations  No OT follow up    Equipment Recommendations  None recommended by OT    Recommendations for Other Services       Precautions / Restrictions Precautions Precautions: Fall Restrictions Weight Bearing Restrictions: No      Mobility Bed Mobility Overal bed mobility: Modified Independent             General bed mobility comments: HOB up  Transfers Overall transfer level: Independent Equipment used: None                  Balance Overall balance assessment: Mild deficits observed, not formally tested                                         ADL either performed or assessed with clinical judgement   ADL                                         General ADL Comments: Supervision for safety, slightly unsteady on his feet.     Vision Baseline Vision/History: Wears glasses Wears Glasses: Reading only Patient Visual Report: No change from baseline       Perception     Praxis      Pertinent Vitals/Pain Pain Assessment: No/denies pain     Hand Dominance Right   Extremity/Trunk Assessment Upper Extremity Assessment Upper Extremity Assessment: Overall WFL for tasks assessed   Lower Extremity Assessment Lower Extremity Assessment: Defer to PT evaluation        Communication Communication Communication: Prefers language other than English(Korean)   Cognition Arousal/Alertness: Awake/alert Behavior During Therapy: WFL for tasks assessed/performed Overall Cognitive Status: Within Functional Limits for tasks assessed                                     General Comments       Exercises     Shoulder Instructions      Home Living Family/patient expects to be discharged to:: Private residence Living Arrangements: Spouse/significant other Available Help at Discharge: Family;Available 24 hours/day Type of Home: Apartment(4 plex) Home Access: Level entry     Home Layout: One level     Bathroom Shower/Tub: Teacher, early years/pre: Standard     Home Equipment: None          Prior Functioning/Environment Level of Independence: Independent        Comments: drives, goes to the gym        OT Problem List: Impaired balance (sitting and/or standing)      OT Treatment/Interventions:  OT Goals(Current goals can be found in the care plan section) Acute Rehab OT Goals Patient Stated Goal: to go home today  OT Frequency:     Barriers to D/C:            Co-evaluation              AM-PAC OT "6 Clicks" Daily Activity     Outcome Measure Help from another person eating meals?: None Help from another person taking care of personal grooming?: A Little Help from another person toileting, which includes using toliet, bedpan, or urinal?: A Little Help from another person bathing (including washing, rinsing, drying)?: None Help from another person to put on and taking off regular upper body clothing?: None Help from another person to put on and taking off regular lower body clothing?: None 6 Click Score: 22   End of Session    Activity Tolerance: Patient tolerated treatment well Patient left: in bed;with call bell/phone within reach  OT Visit Diagnosis: Other abnormalities of gait and mobility  (R26.89)                Time: 5537-4827 OT Time Calculation (min): 28 min Charges:  OT General Charges $OT Visit: 1 Visit OT Evaluation $OT Eval Low Complexity: 1 Low OT Treatments $Self Care/Home Management : 8-22 mins  Nestor Lewandowsky, OTR/L Acute Rehabilitation Services Pager: [phone] Office: [phone]  Malka So 09/25/2018, 9:42 AM

## 2018-09-25 NOTE — TOC Transition Note (Addendum)
Transition of Care [facility]) - CM/SW Discharge Note   Patient Details  Name: Martin Bates MRN: [id number] Date of Birth: May 30, 1931  [facility] of [facility]) CM/SW Contact:  Sharin Mons, RN Phone Number: 09/25/2018, 9:54 AM   Clinical Narrative:   Presented with fever, suspected COVID r/o.   Hx  of COPD, diabetes. Pt speaks [place] and some english. From home with wife. PTA independent with ADL's , no DME usage. NCM spoke with pt regarding d/c planning and pt asked NCM to discuss with daughter in law [name]. NCM spoke with [name] and offered choice if needed for home health services. PT,OT evaluations pending.... [name] selected [facility] if home health services are needed.  [name] (Spouse) [name] ([phone]      PCP: Dr. Deland Pretty  Final next level of care: Home w Home Health Services Barriers to Discharge: Barriers Resolved   Patient Goals and CMS Choice Patient states their goals for this hospitalization and ongoing recovery are:: "going home" CMS Medicare.gov Compare Post Acute Care list provided to:: Patient Choice offered to / list presented to : Patient, Adult Children  Discharge Placement                       Discharge Plan and Services In-house Referral: NA Discharge Planning Services: CM Consult Post Acute Care Choice: Home Health          DME Arranged: Gilford Rile DME Agency: [employer]       Social Determinants of Health (SDOH) Interventions     Readmission Risk Interventions No flowsheet data found.

## 2018-09-25 NOTE — Progress Notes (Signed)
Bilateral lower extremity venous duplex has been completed. Preliminary results can be found in CV Proc through chart review.   09/25/18 9:24 AM Martin Bates RVT

## 2018-09-25 NOTE — Progress Notes (Signed)
Suzy Bouchard to be D/C'd  Home per MD order.  Discussed with the patient and all questions fully answered.  VSS, Skin clean, dry and intact without evidence of skin break down, no evidence of skin tears noted. IV catheter discontinued intact. Site without signs and symptoms of complications. Dressing and pressure applied.  An After Visit Summary was printed and given to the patient. Patient received prescription.  D/c education completed with patient/family including follow up instructions, medication list, d/c activities limitations if indicated, with other d/c instructions as indicated by MD - patient able to verbalize understanding, all questions fully answered.   Patient instructed to return to ED, call 911, or call MD for any changes in condition.   Patient escorted via [place], and D/C home via private auto.   Lorenza Evangelist [facility] 09/25/2018 11:17 AM

## 2018-09-25 NOTE — Discharge Summary (Signed)
Physician Discharge Summary  GERRARD CRYSTAL TDV:[id number] DOB: [birth year] DOA: 09/21/2018  PCP: Deland Pretty, MD  Admit date: 09/21/2018 Discharge date: 09/25/2018  Admitted From: Home Disposition: Home  Recommendations for Outpatient Follow-up:  1. Follow up with PCP in 1 week with repeat CBC/BMP 2. Follow up in ED if symptoms worsen or new appear   Home Health: No Equipment/Devices: None  Discharge Condition: Stable CODE STATUS: Full Diet recommendation: Heart healthy/carb modified  Brief/Interim Summary: 85 year old male with history of COPD, diabetes presented with fever, chills and dry cough.  Chest x-ray revealed mild left basilar subsegmental atelectasis without other active cardiopulmonary disease.  Influenza and respiratory panel were negative.  His blood pressures were on the lower side initially.  He was admitted for suspected COVID-19.  He was started on IV fluids.  During the hospitalization, he became tachycardic and hypoxic requiring oxygen supplementation.  He was transferred to ICU for closer monitoring.  Critical care was consulted.  Respiratory status improved, he was subsequently transferred out of critical care.  He was found to have E. coli UTI and E. coli bacteremia.  He was started on intravenous Rocephin.  His symptoms improved.  He is currently afebrile.  His COVID-19 test has been negative.  His isolation was discontinued.  He will be discharged home on oral Keflex for 5 more days.  Discharge Diagnoses:  Principal Problem:   Suspected Covid-19 Virus Infection Active Problems:   DM type 2 (diabetes mellitus, type 2) (HCC)   Lactic acid acidosis   CAD (coronary artery disease)   Hyperlipidemia  Sepsis from bacteremia and UTI: Probably present on admission -Treated with IV fluids.  Cultures and antibiotic plan as below.  Chest x-ray was negative for any acute cardiopulmonary disease. -Sepsis has resolved.  Hemodynamically stable.  E. coli bacteremia and  UTI -Treated with IV Rocephin.  Currently afebrile.  Hemodynamically stable. -E. coli is pansensitive.  Will switch to oral Keflex and discharge him on Keflex for 5 more days which will complete 10-day course of therapy.  COVID-19 has been ruled out -COVID-19 testing has been negative.  Patient presented with fever and neutropenia.  No active cardiopulmonary disease on chest x-ray.  Influenza and respiratory virus panel negative. -After testing was negative, precautions have been discontinued.  Lactic acidosis--improved with IV fluids  CAD--continue aspirin and statin.  Diabetes mellitus type 2 -Resume home regimen of metformin.  Outpatient follow-up  Hypokalemia--replaced and resolved  Hypomagnesemia -Resolved after replacement  Acute kidney injury -Improved with IV fluids  Normocytic anemia -Hemoglobin stable.  Outpatient follow-up  Thrombocytopenia -Probably from sepsis and bacteremia.  Improving.  No signs of bleeding.  Outpatient follow-up  Mildly positive troponins--probably from sepsis.  Troponins did not trend up.  Outpatient follow-up.  No chest pain.  Discharge Instructions  Discharge Instructions    Call MD for:  difficulty breathing, headache or visual disturbances   Complete by:  As directed    Call MD for:  extreme fatigue   Complete by:  As directed    Call MD for:  persistant dizziness or light-headedness   Complete by:  As directed    Call MD for:  persistant nausea and vomiting   Complete by:  As directed    Call MD for:  temperature >100.4   Complete by:  As directed    Diet - low sodium heart healthy   Complete by:  As directed    Diet Carb Modified   Complete by:  As directed  Increase activity slowly   Complete by:  As directed      Allergies as of 09/25/2018   No Known Allergies     Medication List    TAKE these medications   acetaminophen 500 MG tablet Commonly known as:  TYLENOL Take 1,000 mg by mouth every 6 (six) hours as needed  for mild pain.   aspirin 81 MG tablet Take 81 mg by mouth daily.   atorvastatin 20 MG tablet Commonly known as:  LIPITOR Take 10 mg by mouth daily.   calcium citrate-vitamin D 315-200 MG-UNIT tablet Commonly known as:  CITRACAL+D Take 1 tablet by mouth 2 (two) times daily.   cephALEXin 500 MG capsule Commonly known as:  KEFLEX Take 1 capsule (500 mg total) by mouth every 8 (eight) hours for 5 days.   Fish Oil 1000 MG Caps Take 1 capsule by mouth every morning.   loperamide 2 MG capsule Commonly known as:  IMODIUM Take 1 capsule (2 mg total) by mouth as needed for diarrhea or loose stools.   metFORMIN 1000 MG tablet Commonly known as:  GLUCOPHAGE Take 500-1,000 mg by mouth 2 (two) times daily with a meal. 1000 mg in the morning and 500 mg in the evening   multivitamin capsule Take 1 capsule by mouth daily.   ondansetron 4 MG tablet Commonly known as:  ZOFRAN Take 1 tablet (4 mg total) by mouth every 6 (six) hours as needed for nausea.   vitamin C 100 MG tablet Take 500 mg by mouth daily.      Follow-up Information    Deland Pretty, MD. Schedule an appointment as soon as possible for a visit in 1 week(s).   Specialty:  Internal Medicine Why:  with cbc/bmp  Contact information: [place] [address] [phone]          No Known Allergies  Consultations:  PCCM   Procedures/Studies: US Renal  Result Date: 09/24/2018 CLINICAL DATA:  Acute kidney injury.  History of prostate cancer. EXAM: RENAL / URINARY TRACT ULTRASOUND COMPLETE COMPARISON:  None. FINDINGS: Right Kidney: Renal measurements: 11.9 x 5.5 x 6.6 cm = volume: 225 mL. Cortical thickness and echogenicity are within normal limits. There is a mixed complex cystic and solid mass within the midpole region of the RIGHT kidney, parapelvic, measuring 2.5 x 2.5 x 2.4 cm. No additional cysts or masses within the RIGHT kidney. No hydronephrosis. Left Kidney: Renal measurements:  12.8 x 4.7 x 5.7 cm = volume: 181 mL. Echogenicity within normal limits. No mass or hydronephrosis visualized. Bladder: Appears normal for degree of bladder distention. Contains a small amount of debris. IMPRESSION: 1. Complex cystic and solid mass within the midpole region of the RIGHT kidney, parapelvic, measuring 2.5 cm. No corresponding lesion seen on PET-CT of 07/07/2017. Neoplastic lesion not excluded. Would consider nonemergent renal MRI for further characterization. 2. Kidneys are otherwise unremarkable.  No hydronephrosis. 3. Small amount of debris within the bladder. Bladder appears otherwise normal. Electronically Signed   By: Franki Cabot M.D.   On: 09/24/2018 14:46   Dg Chest Portable 1 View  Result Date: 09/21/2018 CLINICAL DATA:  Initial evaluation for acute cough, shortness of breath. EXAM: PORTABLE CHEST 1 VIEW COMPARISON:  Prior radiograph from 07/10/2004. FINDINGS: The cardiac and mediastinal silhouettes are stable in size and contour, and remain within normal limits. Aortic atherosclerosis. The lungs are normally inflated. Mild left basilar subsegmental atelectasis. No airspace consolidation, pleural effusion, or pulmonary edema is identified. There  is no pneumothorax. No acute osseous abnormality identified. IMPRESSION: 1. Mild left basilar subsegmental atelectasis. No other active cardiopulmonary disease. 2. Aortic atherosclerosis. Electronically Signed   By: Jeannine Boga M.D.   On: 09/21/2018 03:56       Subjective: Patient seen and examined at bedside.  He denies any overnight fever or vomiting.  Feels better and wants to go home.  No worsening diarrhea.  Spoke to daughter-in-law Learta Codding on phone.  Discharge Exam: Vitals:   09/24/18 2134 09/25/18 0527  BP: (!) 142/69 (!) 125/59  Pulse: 72 93  Resp: 18 18  Temp: 99.2 F (37.3 C) 98.6 F (37 C)  SpO2: 95% 95%    General: Pt is alert, awake, not in acute distress Cardiovascular: rate controlled, S1/S2  + Respiratory: bilateral decreased breath sounds at bases, scattered crackles Abdominal: Soft, NT, ND, bowel sounds + Extremities: no edema, no cyanosis    The results of significant diagnostics from this hospitalization (including imaging, microbiology, ancillary and laboratory) are listed below for reference.     Microbiology: Recent Results (from the past 240 hour(s))  Culture, blood (Routine x 2)     Status: Abnormal   Collection Time: 09/21/18  3:00 AM  Result Value Ref Range Status   Specimen Description BLOOD RIGHT ARM  Final   Special Requests   Final    BOTTLES DRAWN AEROBIC AND ANAEROBIC Blood Culture results may not be optimal due to an excessive volume of blood received in culture bottles   Culture  Setup Time   Final    GRAM NEGATIVE RODS ANAEROBIC BOTTLE ONLY CRITICAL VALUE NOTED.  VALUE IS CONSISTENT WITH PREVIOUSLY REPORTED AND CALLED VALUE.    Culture (A)  Final    ESCHERICHIA COLI SUSCEPTIBILITIES PERFORMED ON PREVIOUS CULTURE WITHIN THE LAST 5 DAYS. Performed at [facility], [place] [address] [place], [place] [address]    Report Status 09/23/2018 FINAL  Final  Culture, blood (Routine x 2)     Status: Abnormal   Collection Time: 09/21/18  3:10 AM  Result Value Ref Range Status   Specimen Description BLOOD LEFT ARM  Final   Special Requests   Final    BOTTLES DRAWN AEROBIC AND ANAEROBIC Blood Culture adequate volume   Culture  Setup Time   Final    GRAM NEGATIVE RODS IN BOTH AEROBIC AND ANAEROBIC BOTTLES CRITICAL RESULT CALLED TO, READ BACK BY AND VERIFIED WITHAsencion Islam [facility] 09/21/18 1740 JDW Performed at [facility], [place] [address], [place], [place] [address]    Culture ESCHERICHIA COLI (A)  Final   Report Status 09/23/2018 FINAL  Final   Organism ID, Bacteria ESCHERICHIA COLI  Final      Susceptibility   Escherichia coli - MIC*    AMPICILLIN 4 SENSITIVE Sensitive     CEFAZOLIN <=4 SENSITIVE Sensitive     CEFEPIME <=1 SENSITIVE  Sensitive     CEFTAZIDIME <=1 SENSITIVE Sensitive     CEFTRIAXONE <=1 SENSITIVE Sensitive     CIPROFLOXACIN <=0.25 SENSITIVE Sensitive     GENTAMICIN <=1 SENSITIVE Sensitive     IMIPENEM <=0.25 SENSITIVE Sensitive     TRIMETH/SULFA <=20 SENSITIVE Sensitive     AMPICILLIN/SULBACTAM <=2 SENSITIVE Sensitive     PIP/TAZO <=4 SENSITIVE Sensitive     Extended ESBL NEGATIVE Sensitive     * ESCHERICHIA COLI  Blood Culture ID Panel (Reflexed)     Status: Abnormal   Collection Time: 09/21/18  3:10 AM  Result Value Ref  Range Status   Enterococcus species NOT DETECTED NOT DETECTED Final   Listeria monocytogenes NOT DETECTED NOT DETECTED Final   Staphylococcus species NOT DETECTED NOT DETECTED Final   Staphylococcus aureus (BCID) NOT DETECTED NOT DETECTED Final   Streptococcus species NOT DETECTED NOT DETECTED Final   Streptococcus agalactiae NOT DETECTED NOT DETECTED Final   Streptococcus pneumoniae NOT DETECTED NOT DETECTED Final   Streptococcus pyogenes NOT DETECTED NOT DETECTED Final   Acinetobacter baumannii NOT DETECTED NOT DETECTED Final   Enterobacteriaceae species DETECTED (A) NOT DETECTED Final    Comment: Enterobacteriaceae represent a large family of gram-negative bacteria, not a single organism. CRITICAL RESULT CALLED TO, READ BACK BY AND VERIFIED WITH: B MANCHERIL PHARMD 09/21/18 1740 JDW    Enterobacter cloacae complex NOT DETECTED NOT DETECTED Final   Escherichia coli DETECTED (A) NOT DETECTED Final    Comment: CRITICAL RESULT CALLED TO, READ BACK BY AND VERIFIED WITH: B MANCHERIL PHARMD 09/21/18 1740 JDW    Klebsiella oxytoca NOT DETECTED NOT DETECTED Final   Klebsiella pneumoniae NOT DETECTED NOT DETECTED Final   Proteus species NOT DETECTED NOT DETECTED Final   Serratia marcescens NOT DETECTED NOT DETECTED Final   Carbapenem resistance NOT DETECTED NOT DETECTED Final   Haemophilus influenzae NOT DETECTED NOT DETECTED Final   Neisseria meningitidis NOT DETECTED NOT DETECTED  Final   Pseudomonas aeruginosa NOT DETECTED NOT DETECTED Final   Candida albicans NOT DETECTED NOT DETECTED Final   Candida glabrata NOT DETECTED NOT DETECTED Final   Candida krusei NOT DETECTED NOT DETECTED Final   Candida parapsilosis NOT DETECTED NOT DETECTED Final   Candida tropicalis NOT DETECTED NOT DETECTED Final    Comment: Performed at [facility], [place] [address], [place], [place] [address]  Respiratory Panel by PCR     Status: None   Collection Time: 09/21/18  3:35 AM  Result Value Ref Range Status   Adenovirus NOT DETECTED NOT DETECTED Final   Coronavirus 229E NOT DETECTED NOT DETECTED Final    Comment: (NOTE) The Coronavirus on the Respiratory Panel, DOES NOT test for the novel  Coronavirus (2019 nCoV)    Coronavirus HKU1 NOT DETECTED NOT DETECTED Final   Coronavirus NL63 NOT DETECTED NOT DETECTED Final   Coronavirus OC43 NOT DETECTED NOT DETECTED Final   Metapneumovirus NOT DETECTED NOT DETECTED Final   Rhinovirus / Enterovirus NOT DETECTED NOT DETECTED Final   Influenza A NOT DETECTED NOT DETECTED Final   Influenza B NOT DETECTED NOT DETECTED Final   Parainfluenza Virus 1 NOT DETECTED NOT DETECTED Final   Parainfluenza Virus 2 NOT DETECTED NOT DETECTED Final   Parainfluenza Virus 3 NOT DETECTED NOT DETECTED Final   Parainfluenza Virus 4 NOT DETECTED NOT DETECTED Final   Respiratory Syncytial Virus NOT DETECTED NOT DETECTED Final   Bordetella pertussis NOT DETECTED NOT DETECTED Final   Chlamydophila pneumoniae NOT DETECTED NOT DETECTED Final   Mycoplasma pneumoniae NOT DETECTED NOT DETECTED Final    Comment: Performed at [facility], [place]. [address] [place], [place] [address]  Novel Coronavirus, NAA (hospital order; send-out to ref lab)     Status: None   Collection Time: 09/21/18  3:35 AM  Result Value Ref Range Status   SARS-CoV-2, NAA NOT DETECTED NOT DETECTED Final    Comment: Negative (Not Detected) results do not exclude infection caused by SARS  CoV 2 and should not be used as the sole basis for treatment or other patient management decisions. Optimum specimen types and timing  for peak viral levels during infections caused  by SARS CoV 2 have not been determined. Collection of multiple specimens (types and time points) from the same patient may be necessary to detect the virus. Improper specimen collection and handling, sequence variability underlying assay primers and or probes, or the presence of organisms in  quantities less than the limit of detection of the assay may lead to false negative results. Positive and negative predictive values of testing are highly dependent on prevalence. False negative results are more likely when prevalence of disease is high. (NOTE) The expected result is Negative (Not Detected). The SARS CoV 2 test is intended for the presumptive qualitative  detection of nucleic acid from SARS CoV 2 in upper and lower  respir atory specimens. Testing methodology is real time RT PCR. Test results must be correlated with clinical presentation and  evaluated in the context of other laboratory and epidemiologic data.  Test performance can be affected because the epidemiology and  clinical spectrum of infection caused by SARS CoV 2 is not fully  known. For example, the optimum types of specimens to collect and  when during the course of infection these specimens are most likely  to contain detectable viral RNA may not be known. This test has not been Food and Drug Administration (FDA) cleared or  approved and has been authorized by FDA under an Emergency Use  Authorization (EUA). The test is only authorized for the duration of  the declaration that circumstances exist justifying the authorization  of emergency use of in vitro diagnostic tests for detection and or  diagnosis of SARS CoV 2 under Section 564(b)(1) of the Act, 21 U.S.C.  section 2516386525 3(b)(1), unless the authorization is terminated or   revoked  sooner. [facility] is certified under the  Clinical Laboratory Improvement Amendments of 1988 (CLIA), 42 U.S.C.  section 5167521058, to perform high complexity tests. Performed at [place] [id number], [place], [place] [address] Laboratory Director: Loleta Books, MD Fact Sheet for Healthcare Providers  BankingDealers.co.za Fact Sheet for Patients  StrictlyIdeas.no    Coronavirus Source NASOPHARYNGEAL  Final    Comment: Performed at [facility], [address] [place], [place] [address]  Urine culture     Status: Abnormal   Collection Time: 09/21/18  6:57 AM  Result Value Ref Range Status   Specimen Description URINE, CLEAN CATCH  Final   Special Requests   Final    NONE Performed at [facility], [place] [address] [place], [place] [address]    Culture >=100,000 COLONIES/mL ESCHERICHIA COLI (A)  Final   Report Status 09/23/2018 FINAL  Final   Organism ID, Bacteria ESCHERICHIA COLI (A)  Final      Susceptibility   Escherichia coli - MIC*    AMPICILLIN 4 SENSITIVE Sensitive     CEFAZOLIN <=4 SENSITIVE Sensitive     CEFTRIAXONE <=1 SENSITIVE Sensitive     CIPROFLOXACIN <=0.25 SENSITIVE Sensitive     GENTAMICIN <=1 SENSITIVE Sensitive     IMIPENEM <=0.25 SENSITIVE Sensitive     NITROFURANTOIN <=16 SENSITIVE Sensitive     TRIMETH/SULFA <=20 SENSITIVE Sensitive     AMPICILLIN/SULBACTAM <=2 SENSITIVE Sensitive     PIP/TAZO <=4 SENSITIVE Sensitive     Extended ESBL NEGATIVE Sensitive     * >=100,000 COLONIES/mL ESCHERICHIA COLI  Culture, blood (routine x 2)     Status: None (Preliminary result)   Collection  Time: 09/23/18  8:25 AM  Result Value Ref Range Status   Specimen Description BLOOD RIGHT ANTECUBITAL  Final   Special Requests AEROBIC BOTTLE ONLY Blood Culture adequate volume  Final   Culture   Final    NO GROWTH 1 DAY Performed at [facility], [address], [place], [place] [address]    Report Status PENDING  Incomplete  Culture, blood (routine x 2)     Status: None (Preliminary result)   Collection Time: 09/23/18  8:30 AM  Result Value Ref Range Status   Specimen Description BLOOD LEFT HAND  Final   Special Requests AEROBIC BOTTLE ONLY Blood Culture adequate volume  Final   Culture   Final    NO GROWTH 1 DAY Performed at [facility], [place] [address], [place], [place] [address]    Report Status PENDING  Incomplete     Labs: BNP (last 3 results) No results for input(s): BNP in the last 8760 hours. Basic Metabolic Panel: Recent Labs  Lab 09/22/18 0330 09/23/18 0303 09/23/18 0831 09/24/18 0609 09/25/18 0025  NA 137 136 138 137 136  K 2.9* 4.7 4.5 3.3* 4.1  CL 103 107 109 99 102  CO2 20* 15* 17* 23 21*  GLUCOSE 93 139* 115* 162* 148*  BUN 25* 24* 24* 13 10  CREATININE 1.39* 1.14 1.04 0.82 0.77  CALCIUM 8.7* 8.8* 8.7* 8.5* 8.7*  MG 1.4* 2.2  --  1.7 2.0  PHOS  --  1.6*  --  2.9 3.2   Liver Function Tests: Recent Labs  Lab 09/21/18 0323 09/23/18 0303 09/23/18 0831 09/24/18 0609 09/25/18 0025  AST 16 53* 42* 32 30  ALT 12 23 21 21 23   ALKPHOS 65 110 77 89 84  BILITOT 1.2 1.0 0.6 0.6 0.5  PROT 7.3 6.2* 5.6* 6.0* 6.1*  ALBUMIN 3.7 2.9* 2.6* 2.7* 2.8*   No results for input(s): LIPASE, AMYLASE in the last 168 hours. No results for input(s): AMMONIA in the last 168 hours. CBC: Recent Labs  Lab 09/21/18 0323 09/21/18 1131 09/22/18 0330 09/23/18 0303 09/24/18 0609 09/25/18 0025  WBC 1.1* 3.4* 7.9 10.2 8.9 6.6  NEUTROABS 0.7*  --  7.2 8.9* 7.1 4.4  HGB 11.3* 10.7* 9.9* 10.2* 10.2* 10.2*  HCT 34.4* 31.6* 29.6* 30.2* 28.7* 29.2*  MCV 92.2 92.4 91.1 90.1 88.0 87.7  PLT 164 125* 79* 73* 87* 99*   Cardiac Enzymes: Recent Labs  Lab 09/23/18 0905 09/24/18 0609 09/24/18 1301 09/24/18 1837 09/25/18 0025  CKTOTAL 493* 207  --   --   --   TROPONINI  --  0.48* 0.15* 0.10* 0.07*    BNP: Invalid input(s): POCBNP CBG: Recent Labs  Lab 09/24/18 0827 09/24/18 1127 09/24/18 1706 09/24/18 2134 09/25/18 0758  GLUCAP 175* 157* 130* 167* 149*   D-Dimer Recent Labs    09/23/18 0303 09/24/18 0609  DDIMER 5.17* 1.83*   Hgb A1c Recent Labs    09/24/18 0609  HGBA1C 6.1*   Lipid Profile No results for input(s): CHOL, HDL, LDLCALC, TRIG, CHOLHDL, LDLDIRECT in the last 72 hours. Thyroid function studies No results for input(s): TSH, T4TOTAL, T3FREE, THYROIDAB in the last 72 hours.  Invalid input(s): FREET3 Anemia work up Recent Labs    09/23/18 0303 09/24/18 0609  VITAMINB12  --  963*  FOLATE  --  19.5  FERRITIN 226 189  TIBC  --  235*  IRON  --  63  RETICCTPCT  --  0.6  Urinalysis    Component Value Date/Time   COLORURINE YELLOW 09/21/2018 [place] 09/21/2018 0551   LABSPEC 1.010 09/21/2018 0551   PHURINE 6.0 09/21/2018 0551   GLUCOSEU NEGATIVE 09/21/2018 0551   HGBUR NEGATIVE 09/21/2018 0551   BILIRUBINUR NEGATIVE 09/21/2018 0551   KETONESUR NEGATIVE 09/21/2018 0551   PROTEINUR NEGATIVE 09/21/2018 0551   NITRITE NEGATIVE 09/21/2018 0551   LEUKOCYTESUR SMALL (A) 09/21/2018 0551   Sepsis Labs Invalid input(s): PROCALCITONIN,  WBC,  LACTICIDVEN Microbiology Recent Results (from the past 240 hour(s))  Culture, blood (Routine x 2)     Status: Abnormal   Collection Time: 09/21/18  3:00 AM  Result Value Ref Range Status   Specimen Description BLOOD RIGHT ARM  Final   Special Requests   Final    BOTTLES DRAWN AEROBIC AND ANAEROBIC Blood Culture results may not be optimal due to an excessive volume of blood received in culture bottles   Culture  Setup Time   Final    GRAM NEGATIVE RODS ANAEROBIC BOTTLE ONLY CRITICAL VALUE NOTED.  VALUE IS CONSISTENT WITH PREVIOUSLY REPORTED AND CALLED VALUE.    Culture (A)  Final    ESCHERICHIA COLI SUSCEPTIBILITIES PERFORMED ON PREVIOUS CULTURE WITHIN THE LAST 5 DAYS. Performed at [facility], [place] [address], [place], [place] [address]    Report Status 09/23/2018 FINAL  Final  Culture, blood (Routine x 2)     Status: Abnormal   Collection Time: 09/21/18  3:10 AM  Result Value Ref Range Status   Specimen Description BLOOD LEFT ARM  Final   Special Requests   Final    BOTTLES DRAWN AEROBIC AND ANAEROBIC Blood Culture adequate volume   Culture  Setup Time   Final    GRAM NEGATIVE RODS IN BOTH AEROBIC AND ANAEROBIC BOTTLES CRITICAL RESULT CALLED TO, READ BACK BY AND VERIFIED WITHAsencion Islam [facility] 09/21/18 1740 JDW Performed at [facility], [place] [address] [place], [place] [address]    Culture ESCHERICHIA COLI (A)  Final   Report Status 09/23/2018 FINAL  Final   Organism ID, Bacteria ESCHERICHIA COLI  Final      Susceptibility   Escherichia coli - MIC*    AMPICILLIN 4 SENSITIVE Sensitive     CEFAZOLIN <=4 SENSITIVE Sensitive     CEFEPIME <=1 SENSITIVE Sensitive     CEFTAZIDIME <=1 SENSITIVE Sensitive     CEFTRIAXONE <=1 SENSITIVE Sensitive     CIPROFLOXACIN <=0.25 SENSITIVE Sensitive     GENTAMICIN <=1 SENSITIVE Sensitive     IMIPENEM <=0.25 SENSITIVE Sensitive     TRIMETH/SULFA <=20 SENSITIVE Sensitive     AMPICILLIN/SULBACTAM <=2 SENSITIVE Sensitive     PIP/TAZO <=4 SENSITIVE Sensitive     Extended ESBL NEGATIVE Sensitive     * ESCHERICHIA COLI  Blood Culture ID Panel (Reflexed)     Status: Abnormal   Collection Time: 09/21/18  3:10 AM  Result Value Ref Range Status   Enterococcus species NOT DETECTED NOT DETECTED Final   Listeria monocytogenes NOT DETECTED NOT DETECTED Final   Staphylococcus species NOT DETECTED NOT DETECTED Final   Staphylococcus aureus (BCID) NOT DETECTED NOT DETECTED Final   Streptococcus species NOT DETECTED NOT DETECTED Final   Streptococcus agalactiae NOT DETECTED NOT DETECTED Final   Streptococcus pneumoniae NOT DETECTED NOT DETECTED Final   Streptococcus pyogenes NOT DETECTED NOT DETECTED Final   Acinetobacter  baumannii NOT DETECTED NOT DETECTED Final   Enterobacteriaceae species DETECTED (A) NOT DETECTED Final  Comment: Enterobacteriaceae represent a large family of gram-negative bacteria, not a single organism. CRITICAL RESULT CALLED TO, READ BACK BY AND VERIFIED WITH: B MANCHERIL PHARMD 09/21/18 1740 JDW    Enterobacter cloacae complex NOT DETECTED NOT DETECTED Final   Escherichia coli DETECTED (A) NOT DETECTED Final    Comment: CRITICAL RESULT CALLED TO, READ BACK BY AND VERIFIED WITH: B MANCHERIL PHARMD 09/21/18 1740 JDW    Klebsiella oxytoca NOT DETECTED NOT DETECTED Final   Klebsiella pneumoniae NOT DETECTED NOT DETECTED Final   Proteus species NOT DETECTED NOT DETECTED Final   Serratia marcescens NOT DETECTED NOT DETECTED Final   Carbapenem resistance NOT DETECTED NOT DETECTED Final   Haemophilus influenzae NOT DETECTED NOT DETECTED Final   Neisseria meningitidis NOT DETECTED NOT DETECTED Final   Pseudomonas aeruginosa NOT DETECTED NOT DETECTED Final   Candida albicans NOT DETECTED NOT DETECTED Final   Candida glabrata NOT DETECTED NOT DETECTED Final   Candida krusei NOT DETECTED NOT DETECTED Final   Candida parapsilosis NOT DETECTED NOT DETECTED Final   Candida tropicalis NOT DETECTED NOT DETECTED Final    Comment: Performed at [facility], [place] [address], [place], [place] [address]  Respiratory Panel by PCR     Status: None   Collection Time: 09/21/18  3:35 AM  Result Value Ref Range Status   Adenovirus NOT DETECTED NOT DETECTED Final   Coronavirus 229E NOT DETECTED NOT DETECTED Final    Comment: (NOTE) The Coronavirus on the Respiratory Panel, DOES NOT test for the novel  Coronavirus (2019 nCoV)    Coronavirus HKU1 NOT DETECTED NOT DETECTED Final   Coronavirus NL63 NOT DETECTED NOT DETECTED Final   Coronavirus OC43 NOT DETECTED NOT DETECTED Final   Metapneumovirus NOT DETECTED NOT DETECTED Final   Rhinovirus / Enterovirus NOT DETECTED NOT DETECTED Final   Influenza A  NOT DETECTED NOT DETECTED Final   Influenza B NOT DETECTED NOT DETECTED Final   Parainfluenza Virus 1 NOT DETECTED NOT DETECTED Final   Parainfluenza Virus 2 NOT DETECTED NOT DETECTED Final   Parainfluenza Virus 3 NOT DETECTED NOT DETECTED Final   Parainfluenza Virus 4 NOT DETECTED NOT DETECTED Final   Respiratory Syncytial Virus NOT DETECTED NOT DETECTED Final   Bordetella pertussis NOT DETECTED NOT DETECTED Final   Chlamydophila pneumoniae NOT DETECTED NOT DETECTED Final   Mycoplasma pneumoniae NOT DETECTED NOT DETECTED Final    Comment: Performed at [facility], [place]. [address], [place], [place] [address]  Novel Coronavirus, NAA (hospital order; send-out to ref lab)     Status: None   Collection Time: 09/21/18  3:35 AM  Result Value Ref Range Status   SARS-CoV-2, NAA NOT DETECTED NOT DETECTED Final    Comment: Negative (Not Detected) results do not exclude infection caused by SARS CoV 2 and should not be used as the sole basis for treatment or other patient management decisions. Optimum specimen types and timing for peak viral levels during infections caused  by SARS CoV 2 have not been determined. Collection of multiple specimens (types and time points) from the same patient may be necessary to detect the virus. Improper specimen collection and handling, sequence variability underlying assay primers and or probes, or the presence of organisms in  quantities less than the limit of detection of the assay may lead to false negative results. Positive and negative predictive values of testing are highly dependent on prevalence. False negative results are more likely when prevalence of disease is high. (NOTE) The  expected result is Negative (Not Detected). The SARS CoV 2 test is intended for the presumptive qualitative  detection of nucleic acid from SARS CoV 2 in upper and lower  respir atory specimens. Testing methodology is real time RT PCR. Test results must be correlated with  clinical presentation and  evaluated in the context of other laboratory and epidemiologic data.  Test performance can be affected because the epidemiology and  clinical spectrum of infection caused by SARS CoV 2 is not fully  known. For example, the optimum types of specimens to collect and  when during the course of infection these specimens are most likely  to contain detectable viral RNA may not be known. This test has not been Food and Drug Administration (FDA) cleared or  approved and has been authorized by FDA under an Emergency Use  Authorization (EUA). The test is only authorized for the duration of  the declaration that circumstances exist justifying the authorization  of emergency use of in vitro diagnostic tests for detection and or  diagnosis of SARS CoV 2 under Section 564(b)(1) of the Act, 21 U.S.C.  section [phone] 3(b)(1), unless the authorization is terminated or   revoked sooner. [facility] is certified under the  Clinical Laboratory Improvement Amendments of 1988 (CLIA), 42 U.S.C.  section [phone], to perform high complexity tests. Performed at [id number] [address], [place], [place] [address] Laboratory Director: Loleta Books, MD Fact Sheet for Healthcare Providers  BankingDealers.co.za Fact Sheet for Patients  StrictlyIdeas.no    Coronavirus Source NASOPHARYNGEAL  Final    Comment: Performed at [facility], [address] [place], [place] [address]  Urine culture     Status: Abnormal   Collection Time: 09/21/18  6:57 AM  Result Value Ref Range Status   Specimen Description URINE, CLEAN CATCH  Final   Special Requests   Final    NONE Performed at [facility], [place] [address], [place], [place] [address]    Culture >=100,000 COLONIES/mL ESCHERICHIA COLI (A)  Final   Report Status 09/23/2018 FINAL  Final   Organism ID, Bacteria  ESCHERICHIA COLI (A)  Final      Susceptibility   Escherichia coli - MIC*    AMPICILLIN 4 SENSITIVE Sensitive     CEFAZOLIN <=4 SENSITIVE Sensitive     CEFTRIAXONE <=1 SENSITIVE Sensitive     CIPROFLOXACIN <=0.25 SENSITIVE Sensitive     GENTAMICIN <=1 SENSITIVE Sensitive     IMIPENEM <=0.25 SENSITIVE Sensitive     NITROFURANTOIN <=16 SENSITIVE Sensitive     TRIMETH/SULFA <=20 SENSITIVE Sensitive     AMPICILLIN/SULBACTAM <=2 SENSITIVE Sensitive     PIP/TAZO <=4 SENSITIVE Sensitive     Extended ESBL NEGATIVE Sensitive     * >=100,000 COLONIES/mL ESCHERICHIA COLI  Culture, blood (routine x 2)     Status: None (Preliminary result)   Collection Time: 09/23/18  8:25 AM  Result Value Ref Range Status   Specimen Description BLOOD RIGHT ANTECUBITAL  Final   Special Requests AEROBIC BOTTLE ONLY Blood Culture adequate volume  Final   Culture   Final    NO GROWTH 1 DAY Performed at [facility], [place] [address] [place], [place] [address]    Report Status PENDING  Incomplete  Culture, blood (routine x 2)     Status: None (Preliminary result)   Collection Time: 09/23/18  8:30 AM  Result Value Ref Range Status  Specimen Description BLOOD LEFT HAND  Final   Special Requests AEROBIC BOTTLE ONLY Blood Culture adequate volume  Final   Culture   Final    NO GROWTH 1 DAY Performed at [facility], [place] [address], [place], [place] [address]    Report Status PENDING  Incomplete     Time coordinating discharge: 35 minutes  SIGNED:   Aline August, MD  [facility] 09/25/2018, 9:24 AM

## 2018-09-26 DIAGNOSIS — D649 Anemia, unspecified: Secondary | ICD-10-CM | POA: Diagnosis not present

## 2018-09-26 DIAGNOSIS — B9620 Unspecified Escherichia coli [E. coli] as the cause of diseases classified elsewhere: Secondary | ICD-10-CM | POA: Diagnosis not present

## 2018-09-26 DIAGNOSIS — E1129 Type 2 diabetes mellitus with other diabetic kidney complication: Secondary | ICD-10-CM | POA: Diagnosis not present

## 2018-09-26 DIAGNOSIS — Z7984 Long term (current) use of oral hypoglycemic drugs: Secondary | ICD-10-CM | POA: Diagnosis not present

## 2018-09-26 DIAGNOSIS — E785 Hyperlipidemia, unspecified: Secondary | ICD-10-CM | POA: Diagnosis not present

## 2018-09-26 DIAGNOSIS — Z7982 Long term (current) use of aspirin: Secondary | ICD-10-CM | POA: Diagnosis not present

## 2018-09-26 DIAGNOSIS — N390 Urinary tract infection, site not specified: Secondary | ICD-10-CM | POA: Diagnosis not present

## 2018-09-26 DIAGNOSIS — J439 Emphysema, unspecified: Secondary | ICD-10-CM | POA: Diagnosis not present

## 2018-09-26 DIAGNOSIS — I700 Atherosclerosis of aorta: Secondary | ICD-10-CM | POA: Diagnosis not present

## 2018-09-26 DIAGNOSIS — I2510 Atherosclerotic heart disease of native coronary artery without angina pectoris: Secondary | ICD-10-CM | POA: Diagnosis not present

## 2018-09-26 DIAGNOSIS — Z87891 Personal history of nicotine dependence: Secondary | ICD-10-CM | POA: Diagnosis not present

## 2018-09-27 DIAGNOSIS — Z8619 Personal history of other infectious and parasitic diseases: Secondary | ICD-10-CM | POA: Diagnosis not present

## 2018-09-28 DIAGNOSIS — Z7982 Long term (current) use of aspirin: Secondary | ICD-10-CM | POA: Diagnosis not present

## 2018-09-28 DIAGNOSIS — Z87891 Personal history of nicotine dependence: Secondary | ICD-10-CM | POA: Diagnosis not present

## 2018-09-28 DIAGNOSIS — D649 Anemia, unspecified: Secondary | ICD-10-CM | POA: Diagnosis not present

## 2018-09-28 DIAGNOSIS — E785 Hyperlipidemia, unspecified: Secondary | ICD-10-CM | POA: Diagnosis not present

## 2018-09-28 DIAGNOSIS — E1129 Type 2 diabetes mellitus with other diabetic kidney complication: Secondary | ICD-10-CM | POA: Diagnosis not present

## 2018-09-28 DIAGNOSIS — I700 Atherosclerosis of aorta: Secondary | ICD-10-CM | POA: Diagnosis not present

## 2018-09-28 DIAGNOSIS — Z7984 Long term (current) use of oral hypoglycemic drugs: Secondary | ICD-10-CM | POA: Diagnosis not present

## 2018-09-28 DIAGNOSIS — N390 Urinary tract infection, site not specified: Secondary | ICD-10-CM | POA: Diagnosis not present

## 2018-09-28 DIAGNOSIS — B9620 Unspecified Escherichia coli [E. coli] as the cause of diseases classified elsewhere: Secondary | ICD-10-CM | POA: Diagnosis not present

## 2018-09-28 DIAGNOSIS — J439 Emphysema, unspecified: Secondary | ICD-10-CM | POA: Diagnosis not present

## 2018-09-28 DIAGNOSIS — I2510 Atherosclerotic heart disease of native coronary artery without angina pectoris: Secondary | ICD-10-CM | POA: Diagnosis not present

## 2018-09-28 LAB — CULTURE, BLOOD (ROUTINE X 2)
Culture: NO GROWTH
Culture: NO GROWTH
Special Requests: ADEQUATE
Special Requests: ADEQUATE

## 2018-09-29 DIAGNOSIS — B029 Zoster without complications: Secondary | ICD-10-CM | POA: Diagnosis not present

## 2018-10-02 DIAGNOSIS — E1129 Type 2 diabetes mellitus with other diabetic kidney complication: Secondary | ICD-10-CM | POA: Diagnosis not present

## 2018-10-02 DIAGNOSIS — Z7982 Long term (current) use of aspirin: Secondary | ICD-10-CM | POA: Diagnosis not present

## 2018-10-02 DIAGNOSIS — N390 Urinary tract infection, site not specified: Secondary | ICD-10-CM | POA: Diagnosis not present

## 2018-10-02 DIAGNOSIS — I700 Atherosclerosis of aorta: Secondary | ICD-10-CM | POA: Diagnosis not present

## 2018-10-02 DIAGNOSIS — J439 Emphysema, unspecified: Secondary | ICD-10-CM | POA: Diagnosis not present

## 2018-10-02 DIAGNOSIS — Z7984 Long term (current) use of oral hypoglycemic drugs: Secondary | ICD-10-CM | POA: Diagnosis not present

## 2018-10-02 DIAGNOSIS — B9620 Unspecified Escherichia coli [E. coli] as the cause of diseases classified elsewhere: Secondary | ICD-10-CM | POA: Diagnosis not present

## 2018-10-02 DIAGNOSIS — D649 Anemia, unspecified: Secondary | ICD-10-CM | POA: Diagnosis not present

## 2018-10-02 DIAGNOSIS — I2510 Atherosclerotic heart disease of native coronary artery without angina pectoris: Secondary | ICD-10-CM | POA: Diagnosis not present

## 2018-10-02 DIAGNOSIS — Z87891 Personal history of nicotine dependence: Secondary | ICD-10-CM | POA: Diagnosis not present

## 2018-10-02 DIAGNOSIS — E785 Hyperlipidemia, unspecified: Secondary | ICD-10-CM | POA: Diagnosis not present

## 2018-10-04 DIAGNOSIS — N390 Urinary tract infection, site not specified: Secondary | ICD-10-CM | POA: Diagnosis not present

## 2018-10-04 DIAGNOSIS — E1129 Type 2 diabetes mellitus with other diabetic kidney complication: Secondary | ICD-10-CM | POA: Diagnosis not present

## 2018-10-04 DIAGNOSIS — I2510 Atherosclerotic heart disease of native coronary artery without angina pectoris: Secondary | ICD-10-CM | POA: Diagnosis not present

## 2018-10-04 DIAGNOSIS — Z7984 Long term (current) use of oral hypoglycemic drugs: Secondary | ICD-10-CM | POA: Diagnosis not present

## 2018-10-04 DIAGNOSIS — Z87891 Personal history of nicotine dependence: Secondary | ICD-10-CM | POA: Diagnosis not present

## 2018-10-04 DIAGNOSIS — E785 Hyperlipidemia, unspecified: Secondary | ICD-10-CM | POA: Diagnosis not present

## 2018-10-04 DIAGNOSIS — B9620 Unspecified Escherichia coli [E. coli] as the cause of diseases classified elsewhere: Secondary | ICD-10-CM | POA: Diagnosis not present

## 2018-10-04 DIAGNOSIS — D649 Anemia, unspecified: Secondary | ICD-10-CM | POA: Diagnosis not present

## 2018-10-04 DIAGNOSIS — Z7982 Long term (current) use of aspirin: Secondary | ICD-10-CM | POA: Diagnosis not present

## 2018-10-04 DIAGNOSIS — I700 Atherosclerosis of aorta: Secondary | ICD-10-CM | POA: Diagnosis not present

## 2018-10-04 DIAGNOSIS — J439 Emphysema, unspecified: Secondary | ICD-10-CM | POA: Diagnosis not present

## 2018-10-06 DIAGNOSIS — E1129 Type 2 diabetes mellitus with other diabetic kidney complication: Secondary | ICD-10-CM | POA: Diagnosis not present

## 2018-10-06 DIAGNOSIS — Z7984 Long term (current) use of oral hypoglycemic drugs: Secondary | ICD-10-CM | POA: Diagnosis not present

## 2018-10-06 DIAGNOSIS — I700 Atherosclerosis of aorta: Secondary | ICD-10-CM | POA: Diagnosis not present

## 2018-10-06 DIAGNOSIS — Z87891 Personal history of nicotine dependence: Secondary | ICD-10-CM | POA: Diagnosis not present

## 2018-10-06 DIAGNOSIS — J439 Emphysema, unspecified: Secondary | ICD-10-CM | POA: Diagnosis not present

## 2018-10-06 DIAGNOSIS — B9620 Unspecified Escherichia coli [E. coli] as the cause of diseases classified elsewhere: Secondary | ICD-10-CM | POA: Diagnosis not present

## 2018-10-06 DIAGNOSIS — Z7982 Long term (current) use of aspirin: Secondary | ICD-10-CM | POA: Diagnosis not present

## 2018-10-06 DIAGNOSIS — E785 Hyperlipidemia, unspecified: Secondary | ICD-10-CM | POA: Diagnosis not present

## 2018-10-06 DIAGNOSIS — D649 Anemia, unspecified: Secondary | ICD-10-CM | POA: Diagnosis not present

## 2018-10-06 DIAGNOSIS — I2510 Atherosclerotic heart disease of native coronary artery without angina pectoris: Secondary | ICD-10-CM | POA: Diagnosis not present

## 2018-10-06 DIAGNOSIS — N390 Urinary tract infection, site not specified: Secondary | ICD-10-CM | POA: Diagnosis not present

## 2018-10-09 DIAGNOSIS — I2510 Atherosclerotic heart disease of native coronary artery without angina pectoris: Secondary | ICD-10-CM | POA: Diagnosis not present

## 2018-10-09 DIAGNOSIS — E1129 Type 2 diabetes mellitus with other diabetic kidney complication: Secondary | ICD-10-CM | POA: Diagnosis not present

## 2018-10-09 DIAGNOSIS — Z87891 Personal history of nicotine dependence: Secondary | ICD-10-CM | POA: Diagnosis not present

## 2018-10-09 DIAGNOSIS — I700 Atherosclerosis of aorta: Secondary | ICD-10-CM | POA: Diagnosis not present

## 2018-10-09 DIAGNOSIS — N390 Urinary tract infection, site not specified: Secondary | ICD-10-CM | POA: Diagnosis not present

## 2018-10-09 DIAGNOSIS — Z7982 Long term (current) use of aspirin: Secondary | ICD-10-CM | POA: Diagnosis not present

## 2018-10-09 DIAGNOSIS — B9620 Unspecified Escherichia coli [E. coli] as the cause of diseases classified elsewhere: Secondary | ICD-10-CM | POA: Diagnosis not present

## 2018-10-09 DIAGNOSIS — J439 Emphysema, unspecified: Secondary | ICD-10-CM | POA: Diagnosis not present

## 2018-10-09 DIAGNOSIS — D649 Anemia, unspecified: Secondary | ICD-10-CM | POA: Diagnosis not present

## 2018-10-09 DIAGNOSIS — Z7984 Long term (current) use of oral hypoglycemic drugs: Secondary | ICD-10-CM | POA: Diagnosis not present

## 2018-10-09 DIAGNOSIS — E785 Hyperlipidemia, unspecified: Secondary | ICD-10-CM | POA: Diagnosis not present

## 2018-11-02 DIAGNOSIS — E1129 Type 2 diabetes mellitus with other diabetic kidney complication: Secondary | ICD-10-CM | POA: Diagnosis not present

## 2018-11-02 DIAGNOSIS — B9620 Unspecified Escherichia coli [E. coli] as the cause of diseases classified elsewhere: Secondary | ICD-10-CM | POA: Diagnosis not present

## 2018-11-02 DIAGNOSIS — J439 Emphysema, unspecified: Secondary | ICD-10-CM | POA: Diagnosis not present

## 2018-11-02 DIAGNOSIS — N390 Urinary tract infection, site not specified: Secondary | ICD-10-CM | POA: Diagnosis not present

## 2019-01-08 DIAGNOSIS — N320 Bladder-neck obstruction: Secondary | ICD-10-CM | POA: Diagnosis not present

## 2019-02-02 DIAGNOSIS — E119 Type 2 diabetes mellitus without complications: Secondary | ICD-10-CM | POA: Diagnosis not present

## 2019-02-02 DIAGNOSIS — H40013 Open angle with borderline findings, low risk, bilateral: Secondary | ICD-10-CM | POA: Diagnosis not present

## 2019-07-04 DIAGNOSIS — N320 Bladder-neck obstruction: Secondary | ICD-10-CM | POA: Diagnosis not present

## 2019-07-06 ENCOUNTER — Ambulatory Visit: Payer: Medicare Other | Attending: Internal Medicine

## 2019-07-06 DIAGNOSIS — Z23 Encounter for immunization: Secondary | ICD-10-CM

## 2019-07-06 NOTE — Progress Notes (Signed)
   Covid-19 Vaccination Clinic  Name:  Martin Bates    MRN: [id number] DOB: 24-Nov-1930  07/06/2019  [name] was observed post Covid-19 immunization for 15 minutes without incidence. He was provided with Vaccine Information Sheet and instruction to access the V-Safe system.   [name] was instructed to call 911 with any severe reactions post vaccine: Marland Kitchen Difficulty breathing  . Swelling of your face and throat  . A fast heartbeat  . A bad rash all over your body  . Dizziness and weakness    Immunizations Administered    Name Date Dose VIS Date Route   Pfizer COVID-19 Vaccine 07/06/2019 10:22 AM 0.3 mL 05/25/2019 Intramuscular   Manufacturer: [place]   Lot: [id number]   [place]: [id number]

## 2019-07-25 IMAGING — CT NM PET NOPR SKULL BASE TO THIGH
8 series · 25 of 25 positions shown · non-contrast
Comparison: None.

CLINICAL DATA: Prostate carcinoma with biochemical recurrence.

EXAM:
NUCLEAR MEDICINE PET SKULL BASE TO THIGH
TECHNIQUE: 9.9 mCi F-18 Fluciclovine was injected intravenously. Full-ring PET
imaging was performed from the skull base to thigh after the
radiotracer. CT data was obtained and used for attenuation
correction and anatomic localization.

[Series 3: pet sk_thigh ac · axial · 5.0mm · 4.07mm/px · z∈[+952,+1816]mm · 5 of 217 slices shown]
[im 1/217]
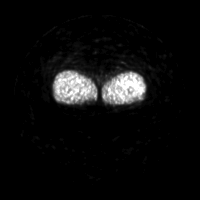
[im 55/217]
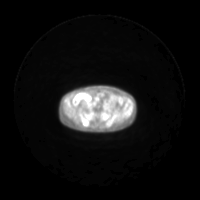
[im 109/217]
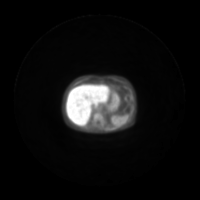
[im 163/217]
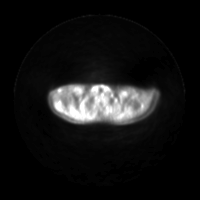
[im 217/217]
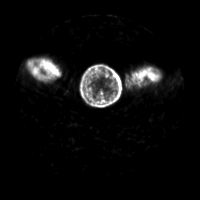

[Series 4: ct sk_thigh 5.0 b31f · axial · 5.0mm · 0.98mm/px · z∈[+952,+1816]mm · 5 of 217 slices shown]
[im 1/217]
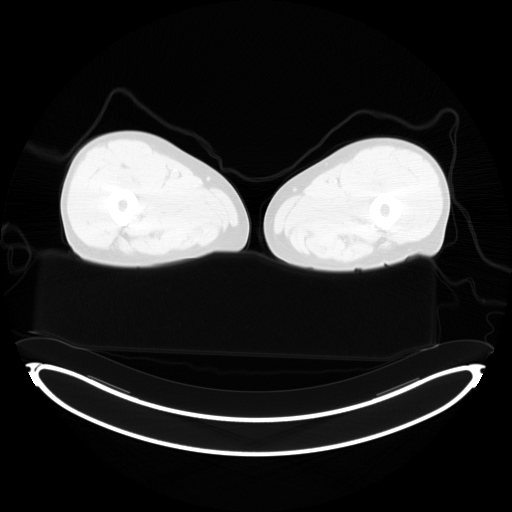
[im 55/217]
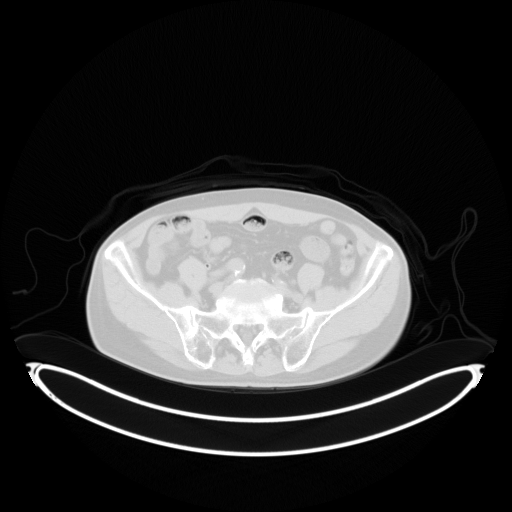
[im 109/217]
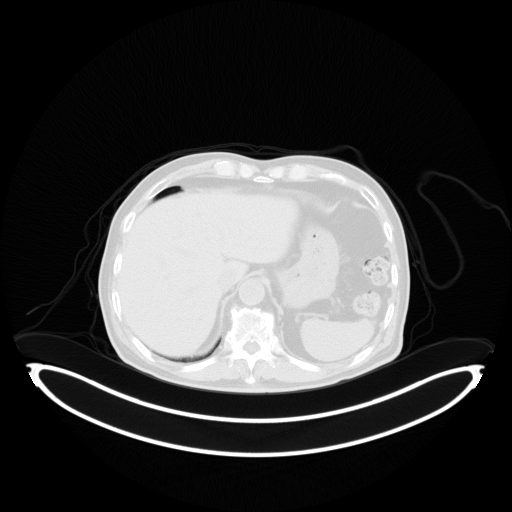
[im 163/217]
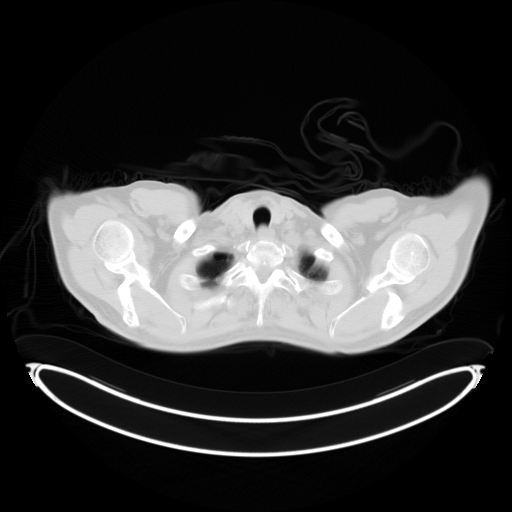
[im 217/217  brain]
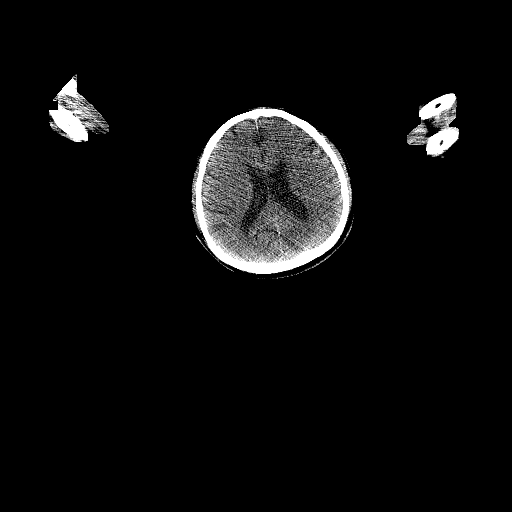

[Series 5: pet sk_thigh nac · axial · 5.0mm · 4.07mm/px · z∈[+952,+1816]mm · 5 of 217 slices shown]
[im 1/217]
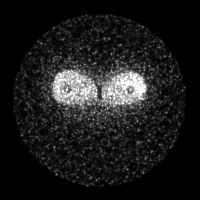
[im 55/217]
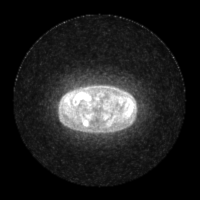
[im 109/217]
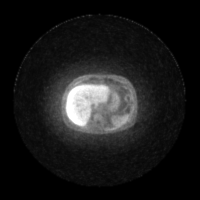
[im 163/217]
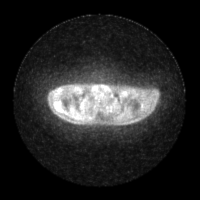
[im 217/217]
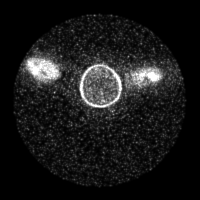

[Series 8: ct sk_thigh 5.0 b70f lung_bone · axial · 5.0mm · 0.57mm/px · 1 of 57 slices shown]
[im 1/57  bone]
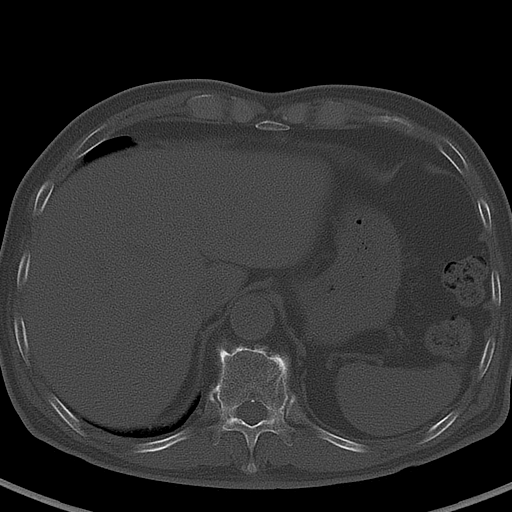

[Series 603: mip range · coronal · 1.79mm/px · 1 of 32 slices shown]
[im 1/32]
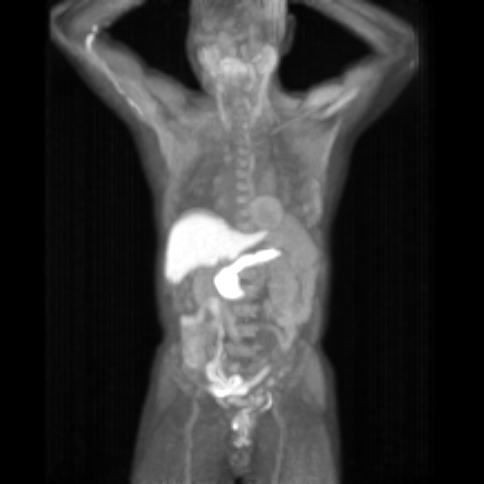

[Series 604: range-ct sk_thigh 5.0 (id)<alpha range> · 2 of 61 slices shown (1 of 2)]
[im 1/61]
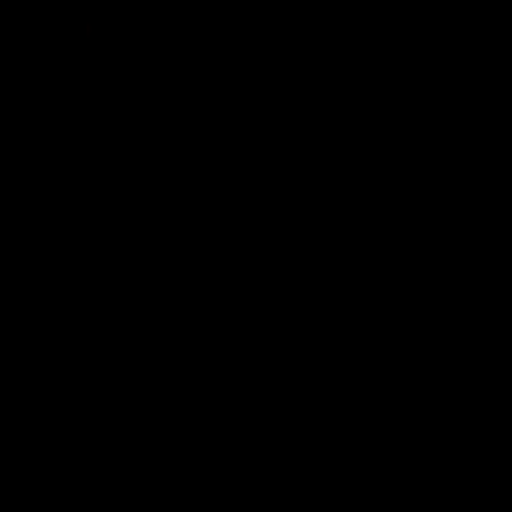
[im 61/61]
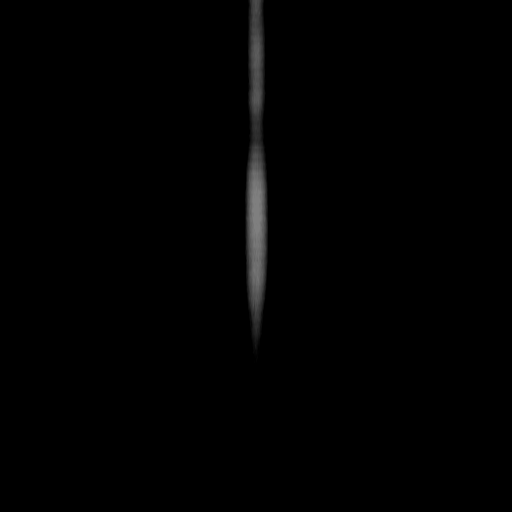

[Series 605: range-ct sk_thigh 5.0 (id)<alpha range> · 5 of 206 slices shown (2 of 2)]
[im 1/206]
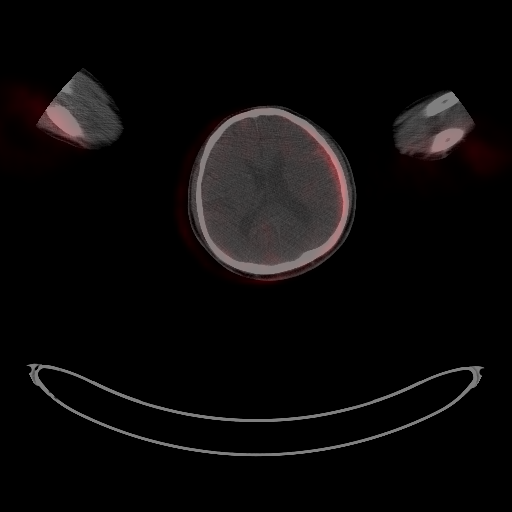
[im 52/206]
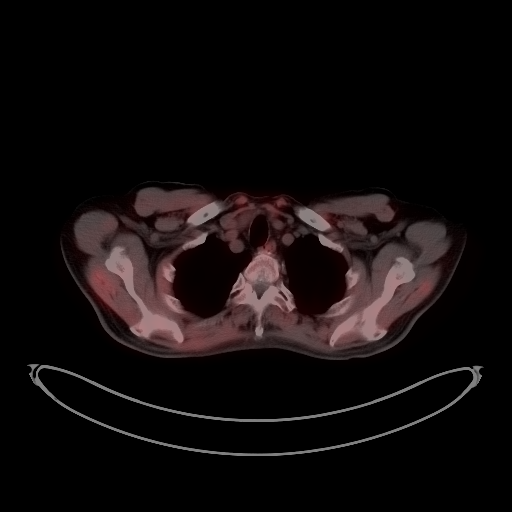
[im 103/206]
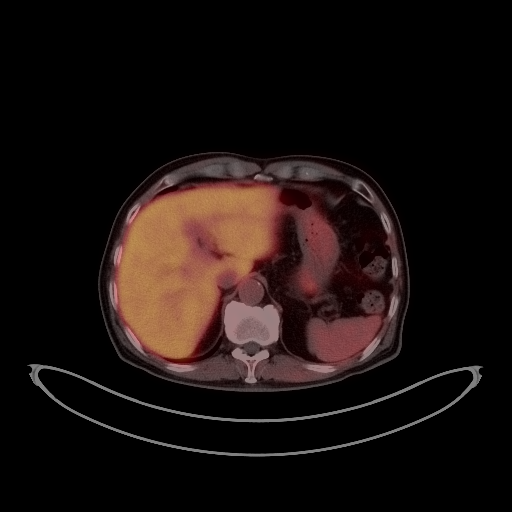
[im 154/206]
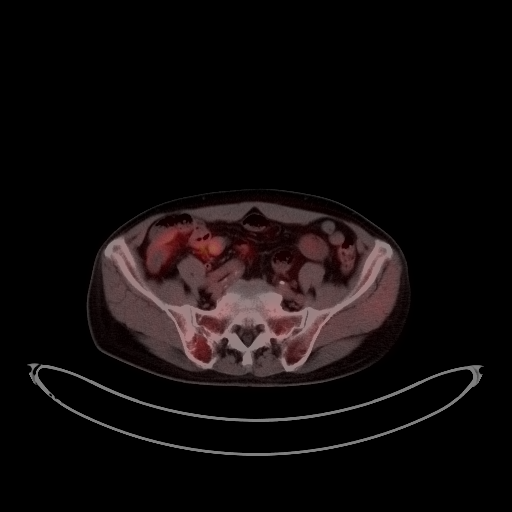
[im 206/206]
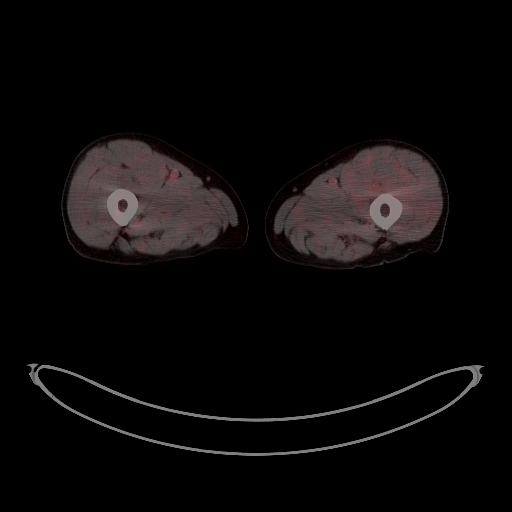

[Series 1077: results mm oncology reading · 1.0mm · 0.89mm/px · 1 of 2 slices shown]
[im 1/2]
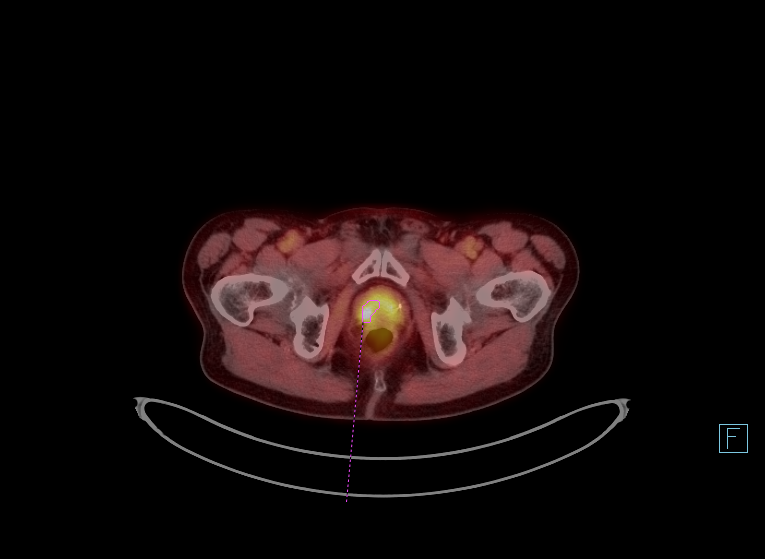

[25 of 25 positions shown; findings below may reference images not displayed]

FINDINGS: NECK

No radiotracer activity in neck lymph nodes.

CHEST

No radiotracer accumulation within mediastinal or hilar lymph nodes.
No suspicious pulmonary nodules on the CT scan.

ABDOMEN/PELVIS

Focal activity in the RIGHT lobe of the prostate gland SUV max equal
9.5. Lower activity in the LEFT lobe with SUV max equal

No radiotracer activity within iliac lymph nodes or retroperitoneal
lymph nodes. No abnormal radiotracer activity in the liver.

Physiologic activity noted within the liver and pancreas.
Atherosclerotic calcification of the aorta.

SKELETON

No focal  activity to suggest skeletal metastasis.
IMPRESSION: 1. Focal activity within the prostate gland involving both lobes but
greater on the RIGHT concerning for prostate cancer recurrence.
2. No evidence of metastatic adenopathy the abdomen pelvis.
3. No evidence distant disease or skeletal disease.

## 2019-07-27 ENCOUNTER — Ambulatory Visit: Payer: Medicare Other | Attending: Internal Medicine

## 2019-07-27 DIAGNOSIS — Z23 Encounter for immunization: Secondary | ICD-10-CM

## 2019-07-27 NOTE — Progress Notes (Signed)
   Covid-19 Vaccination Clinic  Name:  Martin Bates    MRN: [id number] DOB: [birth year]  07/27/2019  Martin Bates was observed post Covid-19 immunization for 15 minutes without incidence. He was provided with Vaccine Information Sheet and instruction to access the V-Safe system.   Martin Bates was instructed to call 911 with any severe reactions post vaccine: Marland Kitchen Difficulty breathing  . Swelling of your face and throat  . A fast heartbeat  . A bad rash all over your body  . Dizziness and weakness    Immunizations Administered    Name Date Dose VIS Date Route   Pfizer COVID-19 Vaccine 07/27/2019 12:34 PM 0.3 mL 05/25/2019 Intramuscular   Manufacturer: [place]   Lot: [id number]   [place]: [id number]

## 2019-09-03 DIAGNOSIS — I10 Essential (primary) hypertension: Secondary | ICD-10-CM | POA: Diagnosis not present

## 2019-09-03 DIAGNOSIS — E118 Type 2 diabetes mellitus with unspecified complications: Secondary | ICD-10-CM | POA: Diagnosis not present

## 2019-09-07 DIAGNOSIS — Z0000 Encounter for general adult medical examination without abnormal findings: Secondary | ICD-10-CM | POA: Diagnosis not present

## 2019-09-07 DIAGNOSIS — J439 Emphysema, unspecified: Secondary | ICD-10-CM | POA: Diagnosis not present

## 2019-09-07 DIAGNOSIS — I10 Essential (primary) hypertension: Secondary | ICD-10-CM | POA: Diagnosis not present

## 2019-09-07 DIAGNOSIS — S61431A Puncture wound without foreign body of right hand, initial encounter: Secondary | ICD-10-CM | POA: Diagnosis not present

## 2019-09-07 DIAGNOSIS — E1121 Type 2 diabetes mellitus with diabetic nephropathy: Secondary | ICD-10-CM | POA: Diagnosis not present

## 2019-09-07 DIAGNOSIS — E875 Hyperkalemia: Secondary | ICD-10-CM | POA: Diagnosis not present

## 2019-09-10 DIAGNOSIS — L03119 Cellulitis of unspecified part of limb: Secondary | ICD-10-CM | POA: Diagnosis not present

## 2020-01-21 DIAGNOSIS — M7542 Impingement syndrome of left shoulder: Secondary | ICD-10-CM | POA: Diagnosis not present

## 2020-01-21 DIAGNOSIS — M19012 Primary osteoarthritis, left shoulder: Secondary | ICD-10-CM | POA: Diagnosis not present

## 2020-01-29 DIAGNOSIS — M25512 Pain in left shoulder: Secondary | ICD-10-CM | POA: Diagnosis not present

## 2020-02-04 DIAGNOSIS — H40013 Open angle with borderline findings, low risk, bilateral: Secondary | ICD-10-CM | POA: Diagnosis not present

## 2020-02-04 DIAGNOSIS — H04123 Dry eye syndrome of bilateral lacrimal glands: Secondary | ICD-10-CM | POA: Diagnosis not present

## 2020-02-05 DIAGNOSIS — M25512 Pain in left shoulder: Secondary | ICD-10-CM | POA: Diagnosis not present

## 2020-02-13 DIAGNOSIS — M25512 Pain in left shoulder: Secondary | ICD-10-CM | POA: Diagnosis not present

## 2020-10-11 IMAGING — US US RENAL
1 series · 14 of 25 positions shown · non-contrast
Comparison: None.

CLINICAL DATA: Acute kidney injury.  History of prostate cancer.

EXAM:
RENAL / URINARY TRACT ULTRASOUND COMPLETE

[Series 1: us renal · 14 of 34 slices shown]
[im 1/34]
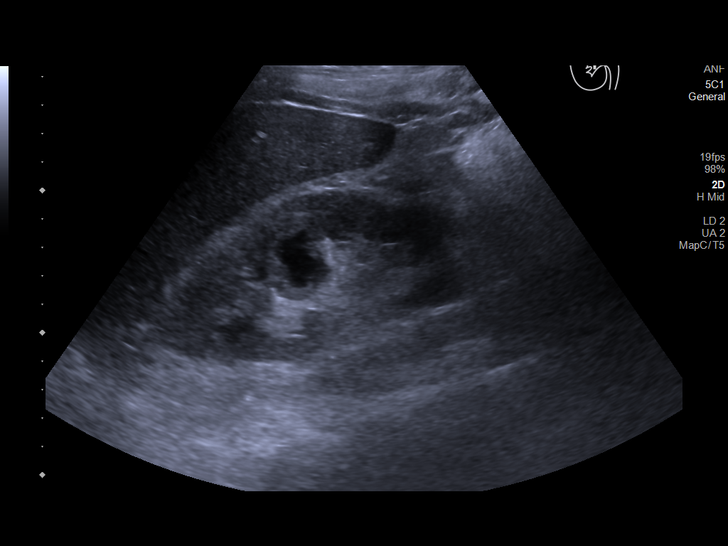
[im 3/34]
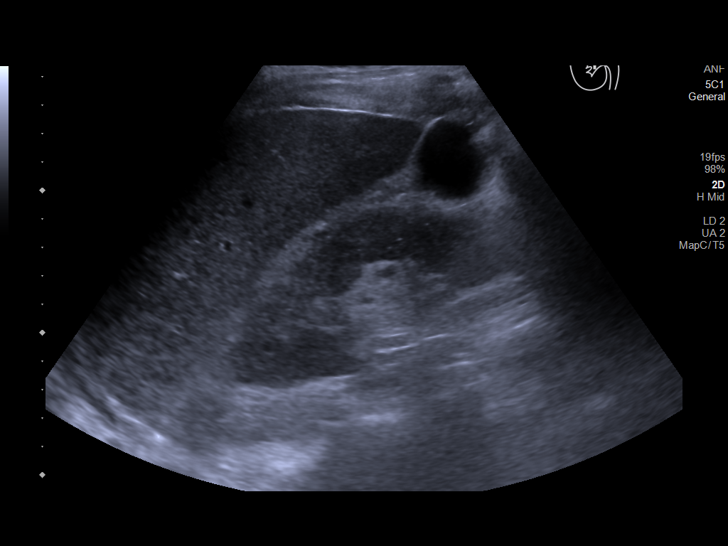
[im 6/34]
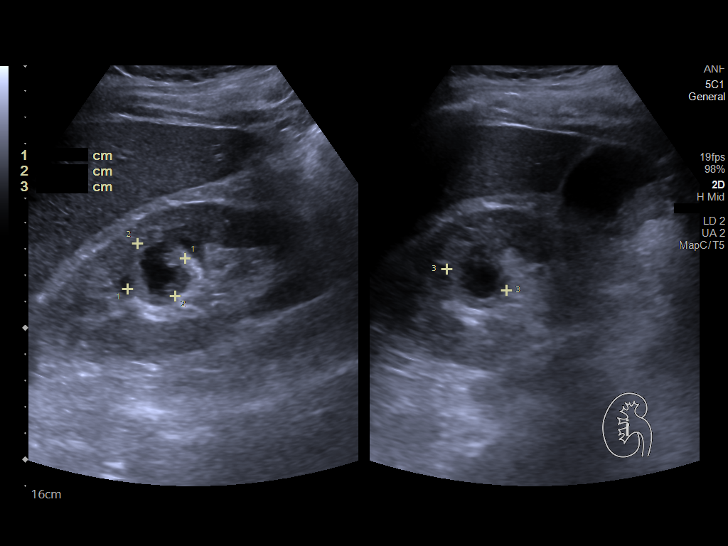
[im 9/34]
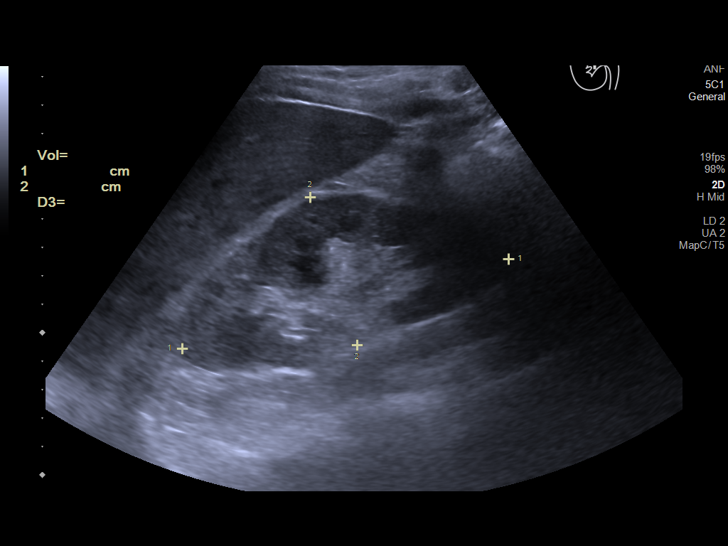
[im 12/34]
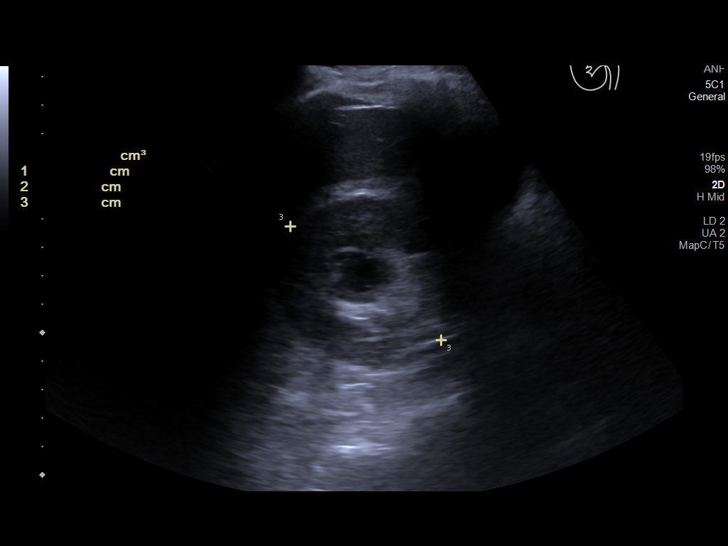
[im 13/34]
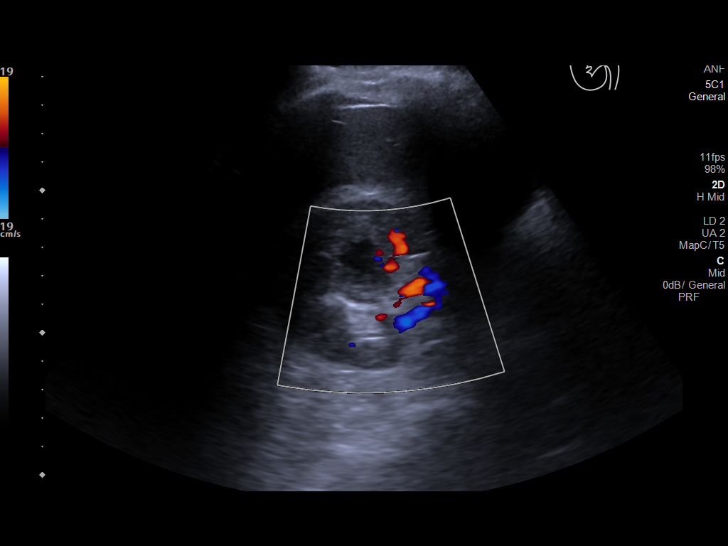
[im 16/34]
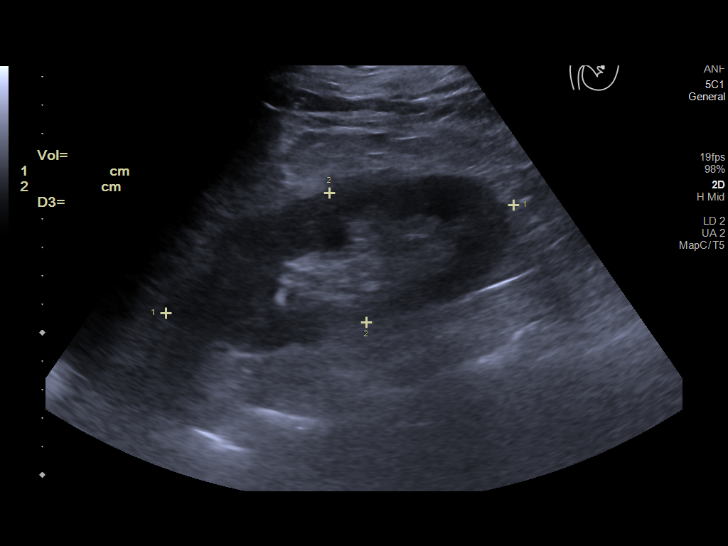
[im 18/34]
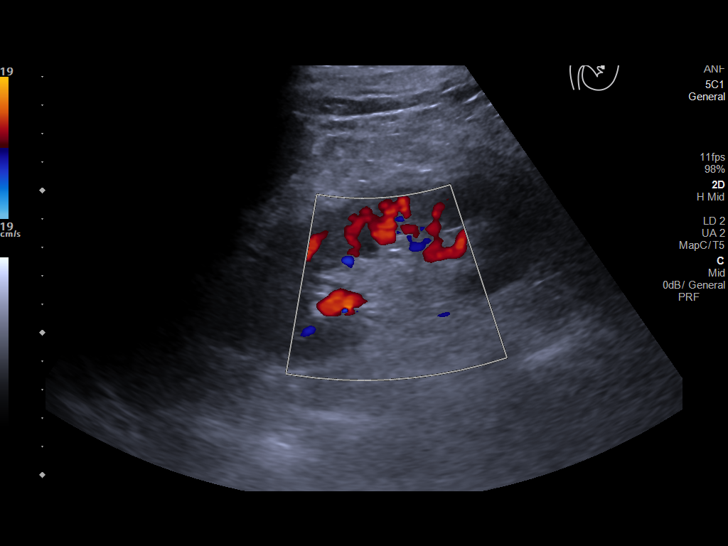
[im 21/34]
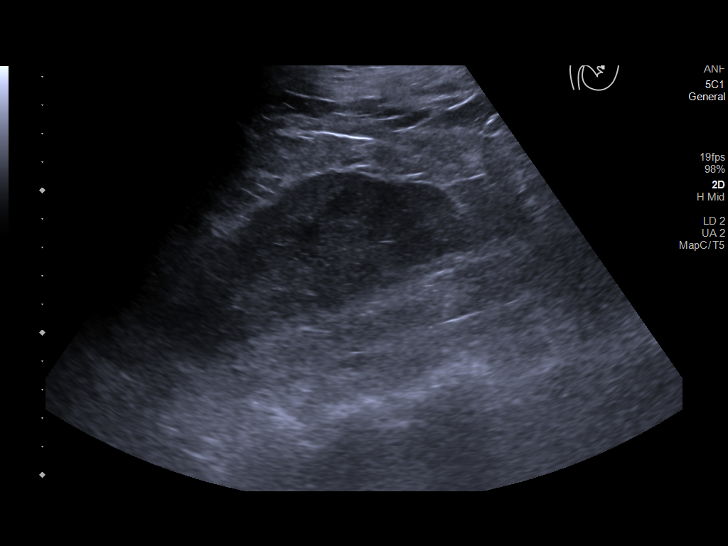
[im 23/34]
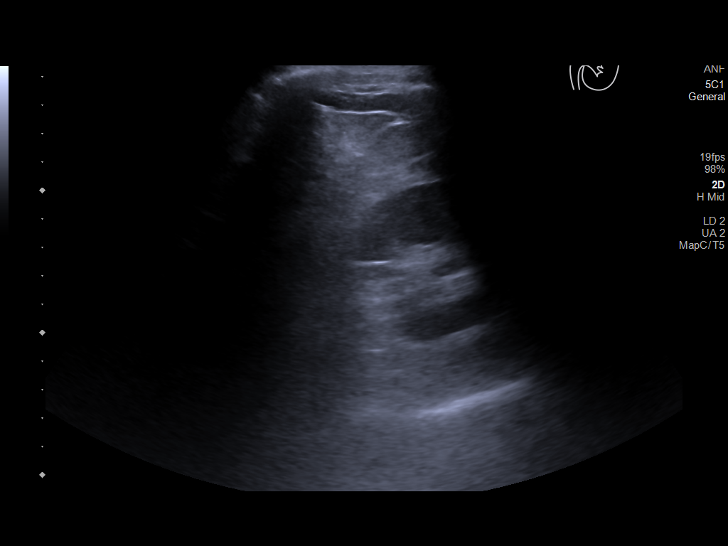
[im 25/34]
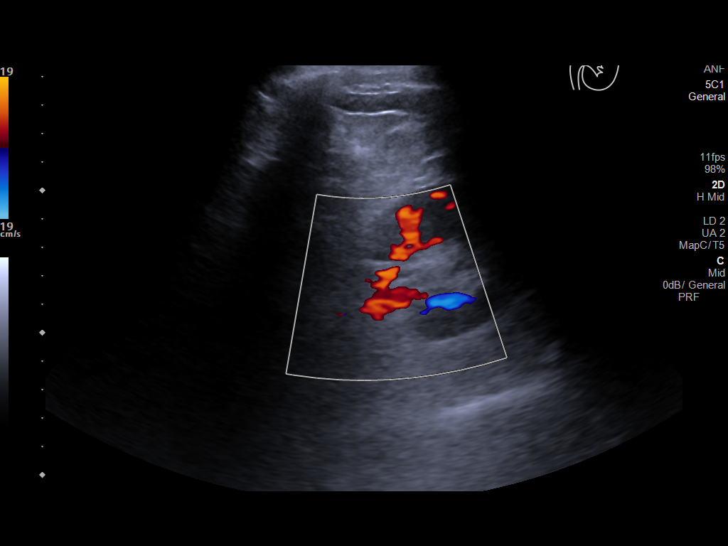
[im 28/34]
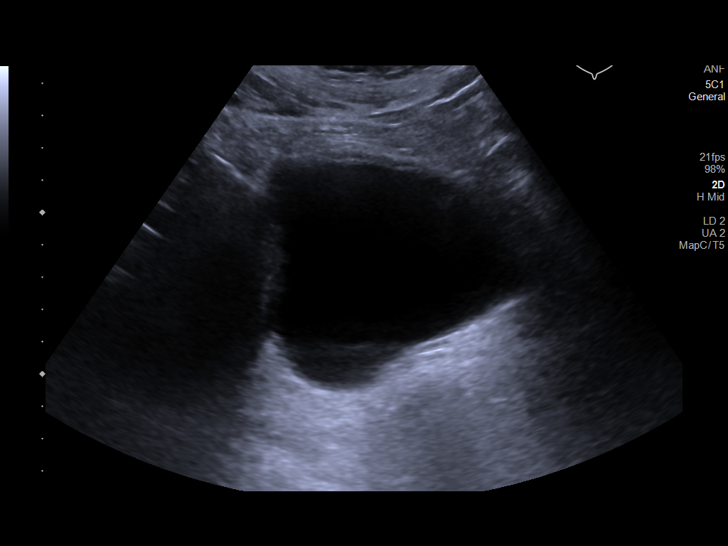
[im 31/34]
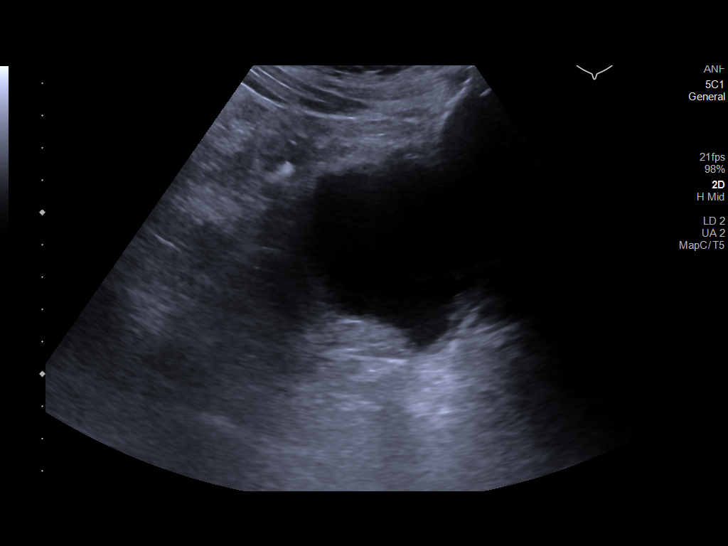
[im 34/34]
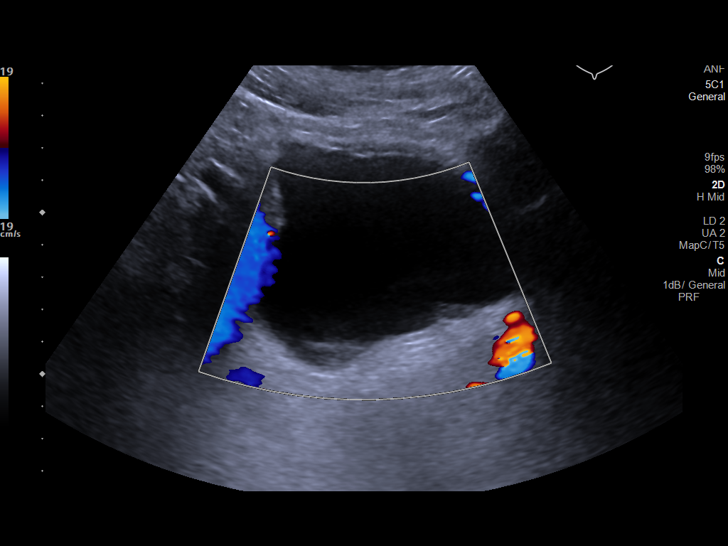

[14 of 25 positions shown; findings below may reference images not displayed]

FINDINGS: Right Kidney:

Renal measurements: 11.9 x 5.5 x 6.6 cm = volume: 225 mL. Cortical
thickness and echogenicity are within normal limits. There is a
mixed complex cystic and solid mass within the midpole region of the
RIGHT kidney, parapelvic, measuring 2.5 x 2.5 x 2.4 cm. No
additional cysts or masses within the RIGHT kidney. No
hydronephrosis.

Left Kidney:

Renal measurements: 12.8 x 4.7 x 5.7 cm = volume: 181 mL.
Echogenicity within normal limits. No mass or hydronephrosis
visualized.

Bladder:

Appears normal for degree of bladder distention. Contains a small
amount of debris.
IMPRESSION: 1. Complex cystic and solid mass within the midpole region of the
RIGHT kidney, parapelvic, measuring 2.5 cm. No corresponding lesion
seen on PET-CT of 07/07/2017. Neoplastic lesion not excluded. Would
consider nonemergent renal MRI for further characterization.
2. Kidneys are otherwise unremarkable.  No hydronephrosis.
3. Small amount of debris within the bladder. Bladder appears
otherwise normal.

## 2020-11-12 DIAGNOSIS — I10 Essential (primary) hypertension: Secondary | ICD-10-CM | POA: Diagnosis not present

## 2020-11-12 DIAGNOSIS — E118 Type 2 diabetes mellitus with unspecified complications: Secondary | ICD-10-CM | POA: Diagnosis not present

## 2020-11-17 DIAGNOSIS — J439 Emphysema, unspecified: Secondary | ICD-10-CM | POA: Diagnosis not present

## 2020-11-17 DIAGNOSIS — D72819 Decreased white blood cell count, unspecified: Secondary | ICD-10-CM | POA: Diagnosis not present

## 2020-11-17 DIAGNOSIS — Z0001 Encounter for general adult medical examination with abnormal findings: Secondary | ICD-10-CM | POA: Diagnosis not present

## 2020-11-17 DIAGNOSIS — E7800 Pure hypercholesterolemia, unspecified: Secondary | ICD-10-CM | POA: Diagnosis not present

## 2020-11-17 DIAGNOSIS — I10 Essential (primary) hypertension: Secondary | ICD-10-CM | POA: Diagnosis not present

## 2020-11-17 DIAGNOSIS — I119 Hypertensive heart disease without heart failure: Secondary | ICD-10-CM | POA: Diagnosis not present

## 2020-11-17 DIAGNOSIS — E118 Type 2 diabetes mellitus with unspecified complications: Secondary | ICD-10-CM | POA: Diagnosis not present

## 2021-04-29 DIAGNOSIS — R8271 Bacteriuria: Secondary | ICD-10-CM | POA: Diagnosis not present

## 2021-05-13 DIAGNOSIS — E785 Hyperlipidemia, unspecified: Secondary | ICD-10-CM | POA: Diagnosis not present

## 2021-05-13 DIAGNOSIS — E118 Type 2 diabetes mellitus with unspecified complications: Secondary | ICD-10-CM | POA: Diagnosis not present

## 2021-05-13 DIAGNOSIS — I10 Essential (primary) hypertension: Secondary | ICD-10-CM | POA: Diagnosis not present

## 2021-05-13 DIAGNOSIS — J439 Emphysema, unspecified: Secondary | ICD-10-CM | POA: Diagnosis not present

## 2021-06-30 DIAGNOSIS — R8271 Bacteriuria: Secondary | ICD-10-CM | POA: Diagnosis not present

## 2021-10-28 DIAGNOSIS — R8271 Bacteriuria: Secondary | ICD-10-CM | POA: Diagnosis not present

## 2021-11-18 DIAGNOSIS — I10 Essential (primary) hypertension: Secondary | ICD-10-CM | POA: Diagnosis not present

## 2021-11-18 DIAGNOSIS — E7800 Pure hypercholesterolemia, unspecified: Secondary | ICD-10-CM | POA: Diagnosis not present

## 2021-11-18 DIAGNOSIS — E118 Type 2 diabetes mellitus with unspecified complications: Secondary | ICD-10-CM | POA: Diagnosis not present

## 2021-11-27 DIAGNOSIS — I10 Essential (primary) hypertension: Secondary | ICD-10-CM | POA: Diagnosis not present

## 2021-11-27 DIAGNOSIS — J349 Unspecified disorder of nose and nasal sinuses: Secondary | ICD-10-CM | POA: Diagnosis not present

## 2021-11-27 DIAGNOSIS — Z23 Encounter for immunization: Secondary | ICD-10-CM | POA: Diagnosis not present

## 2021-11-27 DIAGNOSIS — E785 Hyperlipidemia, unspecified: Secondary | ICD-10-CM | POA: Diagnosis not present

## 2021-11-27 DIAGNOSIS — E118 Type 2 diabetes mellitus with unspecified complications: Secondary | ICD-10-CM | POA: Diagnosis not present

## 2021-11-27 DIAGNOSIS — I119 Hypertensive heart disease without heart failure: Secondary | ICD-10-CM | POA: Diagnosis not present

## 2021-11-27 DIAGNOSIS — Z0000 Encounter for general adult medical examination without abnormal findings: Secondary | ICD-10-CM | POA: Diagnosis not present

## 2021-11-27 DIAGNOSIS — R413 Other amnesia: Secondary | ICD-10-CM | POA: Diagnosis not present

## 2021-11-30 ENCOUNTER — Other Ambulatory Visit: Payer: Self-pay | Admitting: Internal Medicine

## 2021-11-30 DIAGNOSIS — H04123 Dry eye syndrome of bilateral lacrimal glands: Secondary | ICD-10-CM | POA: Diagnosis not present

## 2021-11-30 DIAGNOSIS — H40013 Open angle with borderline findings, low risk, bilateral: Secondary | ICD-10-CM | POA: Diagnosis not present

## 2021-11-30 DIAGNOSIS — R413 Other amnesia: Secondary | ICD-10-CM

## 2021-11-30 DIAGNOSIS — E119 Type 2 diabetes mellitus without complications: Secondary | ICD-10-CM | POA: Diagnosis not present

## 2021-12-18 ENCOUNTER — Ambulatory Visit
Admission: RE | Admit: 2021-12-18 | Discharge: 2021-12-18 | Disposition: A | Discharge status: Home / Self Care | Payer: Medicare Other | Source: Ambulatory Visit | Attending: Internal Medicine | Admitting: Internal Medicine

## 2021-12-18 DIAGNOSIS — R413 Other amnesia: Secondary | ICD-10-CM | POA: Diagnosis not present

## 2021-12-18 DIAGNOSIS — I619 Nontraumatic intracerebral hemorrhage, unspecified: Secondary | ICD-10-CM | POA: Diagnosis not present

## 2021-12-18 DIAGNOSIS — I739 Peripheral vascular disease, unspecified: Secondary | ICD-10-CM | POA: Diagnosis not present

## 2022-02-08 ENCOUNTER — Ambulatory Visit: Payer: Medicare Other | Admitting: Neurology

## 2022-02-08 ENCOUNTER — Encounter: Payer: Self-pay | Admitting: Neurology

## 2022-02-08 VITALS — BP 148/77 | HR 80 | Ht 65.0 in | Wt 129.5 lb

## 2022-02-08 DIAGNOSIS — F02A0 Dementia in other diseases classified elsewhere, mild, without behavioral disturbance, psychotic disturbance, mood disturbance, and anxiety: Secondary | ICD-10-CM

## 2022-02-08 DIAGNOSIS — G301 Alzheimer's disease with late onset: Secondary | ICD-10-CM | POA: Diagnosis not present

## 2022-02-08 MED ORDER — DONEPEZIL HCL 10 MG PO TABS: 10.0000 mg | ORAL_TABLET | Freq: Every day | ORAL | 3 refills | Status: DC

## 2022-02-08 NOTE — Patient Instructions (Addendum)
Start with Aricept 1/2 tablet at bedtime for 4 weeks then increase to full tablet  Continue your other medications Follow up in 1 year or sooner if worse     There are well-accepted and sensible ways to reduce risk for Alzheimers disease and other degenerative brain disorders .  Exercise Daily Walk A daily 20 minute walk should be part of your routine. Disease related apathy can be a significant roadblock to exercise and the only way to overcome this is to make it a daily routine and perhaps have a reward at the end (something your loved one loves to eat or drink perhaps) or a personal trainer coming to the home can also be very useful. Most importantly, the patient is much more likely to exercise if the caregiver / spouse does it with him/her. In general a structured, repetitive schedule is best.  General Health: Any diseases which effect your body will effect your brain such as a pneumonia, urinary infection, blood clot, heart attack or stroke. Keep contact with your primary care doctor for regular follow ups.  Sleep. A good nights sleep is healthy for the brain. Seven hours is recommended. If you have insomnia or poor sleep habits we can give you some instructions. If you have sleep apnea wear your mask.  Diet: Eating a heart healthy diet is also a good idea; fish and poultry instead of red meat, nuts (mostly non-peanuts), vegetables, fruits, olive oil or canola oil (instead of butter), minimal salt (use other spices to flavor foods), whole grain rice, bread, cereal and pasta and wine in moderation.Research is now showing that the MIND diet, which is a combination of The Mediterranean diet and the DASH diet, is beneficial for cognitive processing and longevity. Information about this diet can be found in The MIND Diet, a book by Doyne Keel, MS, RDN, and online at NotebookDistributors.si  Finances, Power of Attorney and Advance Directives: You should consider putting legal  safeguards in place with regard to financial and medical decision making. While the spouse always has power of attorney for medical and financial issues in the absence of any form, you should consider what you want in case the spouse / caregiver is no longer around or capable of making decisions.

## 2022-02-08 NOTE — Progress Notes (Signed)
[facility]  PATIENT: Martin Bates DOB: 1930/12/19  REQUESTING CLINICIAN: Deland Pretty, MD HISTORY FROM: Patient, spouse and daughter  REASON FOR VISIT: Memory problems    HISTORICAL  CHIEF COMPLAINT:  Chief Complaint  Patient presents with   New Patient (Initial Visit)    Rm 12. Accompanied by wife and daughter in law. NP/Paper proficient/Martin Shelia [employer] MD [facility]./Dementia.    HISTORY OF PRESENT ILLNESS:  This is a 88 year old gentleman past medical history of hyperlipidemia, hypertension, diabetes mellitus who is presenting with memory problem.  Patient feels like he is just mildly forgetful but otherwise he is doing well.  Patient does not have a lot of concern regarding his memory, he reports that he still drives, denies being lost in familiar places.  He is still able to manage his affairs and be independent.  Per family, mostly per wife, symptoms started 3 years ago after patient was admitted to ICU.  He presented to the hospital with fever, was thought to have COVID and admitted on the floor but was later found to be COVID-negative but had a UTI which developed into sepsis.  He was admitted to the ICU and since discharge from the hospital, his memory got worse with episodes of confusion.  Wife reports that 1 day he presented to one of his doctors office and did not know why he was there.  He does ask the same questions over and over and repeat himself.  He is very dependent on his wife for activities of daily living.  When they drive, wife has to give him direction and help him navigate.  He is made mistake paying the bills and therefore wife took over the bills.  Other than that he is still very active in his community and home.     TBI:   No past history of TBI Stroke:   no past history of stroke Seizures:      no past history of seizures Sleep:   no history of sleep apnea.  Mood:   patient denies anxiety and depression  Functional status:  independent in most ADLs Patient lives with Wife. Cooking: No  Cleaning: no Shopping: no Bathing: patient, no help needed  Toileting: patient, no help needed  Driving: patient, denies recent accident or being loss but wife assist her with direction. They drive together Bills: Wife took over because patient made mistakes in the past   Ever left the stove on by accident?: Denies Forget how to use items around the house?: Denies Getting lost going to familiar places?: Denies but wife help him with driving Forgetting loved ones names?: Denies Word finding difficulty? Denies Sleep: Good   OTHER MEDICAL CONDITIONS: Diabetes, Hyperlipidemia    REVIEW OF SYSTEMS: Full 14 system review of systems performed and negative with exception of: As noted in the HPI  ALLERGIES: No Known Allergies  HOME MEDICATIONS: Outpatient Medications Prior to Visit  Medication Sig Dispense Refill   acetaminophen (TYLENOL) 500 MG tablet Take 1,000 mg by mouth every 6 (six) hours as needed for mild pain.     Ascorbic Acid (VITAMIN C) 1000 MG tablet Take 500 mg by mouth daily.      aspirin 81 MG tablet Take 81 mg by mouth daily.     atorvastatin (LIPITOR) 10 MG tablet Take 10 mg by mouth daily.      CALCIUM CITRATE-VITAMIN D PO Take 1 tablet by mouth 2 (two) times daily. 600 mg     loperamide (IMODIUM) 2  MG capsule Take 1 capsule (2 mg total) by mouth as needed for diarrhea or loose stools. 14 capsule 0   metFORMIN (GLUCOPHAGE) 1000 MG tablet Take 500-1,000 mg by mouth 2 (two) times daily with a meal. 1000 mg in the morning and 500 mg in the evening     Multiple Vitamin (MULTIVITAMIN) capsule Take 1 capsule by mouth daily.     Omega-3 Fatty Acids (FISH OIL) 1000 MG CAPS Take 1 capsule by mouth every morning.      ondansetron (ZOFRAN) 4 MG tablet Take 1 tablet (4 mg total) by mouth every 6 (six) hours as needed for nausea. 20 tablet 0   No facility-administered medications prior to visit.    PAST MEDICAL  HISTORY: Past Medical History:  Diagnosis Date   Anemia    Normocytic   Cataracts, bilateral    COPD (chronic obstructive pulmonary disease) ([place])    Hyperlipidemia    Internal hemorrhoids    Prostate cancer ([place])    Pulmonary emphysema ([place])    patient denies   Tubulovillous adenoma of colon 2005   Uncontrolled diabetes mellitus with microalbuminuria or microproteinuria     PAST SURGICAL HISTORY: Past Surgical History:  Procedure Laterality Date   COLONOSCOPY W/ POLYPECTOMY  03/30/2017   CRYOABLATION N/A 10/24/2017   Procedure: CRYO ABLATION PROSTATE;  Surgeon: Martin Mallow, MD;  [facility];  Service: Urology;  Laterality: N/A;   PROSTATECTOMY  2008    FAMILY HISTORY: Family History  Problem Relation Age of Onset   Colon cancer Father    Stomach cancer Mother    Esophageal cancer Neg Hx     SOCIAL HISTORY: Social History   Socioeconomic History   Marital status: Married    Spouse name: Not on file   Number of children: 3   Years of education: Not on file   Highest education level: Not on file  Occupational History   Occupation: Retired  Tobacco Use   Smoking status: Former   Smokeless tobacco: Never  Scientific laboratory technician Use: Never used  Substance and Sexual Activity   Alcohol use: No   Drug use: No   Sexual activity: Not on file  Other Topics Concern   Not on file  Social History Narrative   Not on file   Social Determinants of Health   Financial Resource Strain: Not on file  Food Insecurity: Not on file  Transportation Needs: Not on file  Physical Activity: Not on file  Stress: Not on file  Social Connections: Not on file  Intimate Partner Violence: Not on file    PHYSICAL EXAM  GENERAL EXAM/CONSTITUTIONAL: Vitals:  Vitals:   02/08/22 0929  BP: (!) 148/77  Pulse: 80  Weight: 129 lb 8 oz (58.7 kg)  Height: '5\' 5"'$  (1.651 m)   Body mass index is 21.55 kg/m. Wt Readings from Last 3 Encounters:  02/08/22 129  lb 8 oz (58.7 kg)  09/22/18 126 lb 1.7 oz (57.2 kg)  10/24/17 124 lb (56.2 kg)   Patient is in no distress; well developed, nourished and groomed; neck is supple  EYES: Pupils round and reactive to light, Visual fields full to confrontation, Extraocular movements intacts,   MUSCULOSKELETAL: Gait, strength, tone, movements noted in Neurologic exam below  NEUROLOGIC: MENTAL STATUS:      No data to display              02/08/2022    9:31 AM  Montreal Cognitive Assessment  Visuospatial/ Executive (0/5) 4  Naming (0/3) 3    awake, alert, oriented to person fund of knowledge appropriate Difficult to obtain Moca due to language barrier  CRANIAL NERVE:  2nd, 3rd, 4th, 6th - pupils equal and reactive to light, visual fields full to confrontation, extraocular muscles intact, no nystagmus 5th - facial sensation symmetric 7th - facial strength symmetric 8th - hearing intact 9th - palate elevates symmetrically, uvula midline 11th - shoulder shrug symmetric 12th - tongue protrusion midline  MOTOR:  normal bulk and tone, full strength in the BUE, BLE  SENSORY:  normal and symmetric to light touch, vibration  COORDINATION:  finger-nose-finger, fine finger movements normal  REFLEXES:  deep tendon reflexes present and symmetric  GAIT/STATION:  Wide base gait    DIAGNOSTIC DATA (LABS, IMAGING, TESTING) - I reviewed patient records, labs, notes, testing and imaging myself where available.  Lab Results  Component Value Date   WBC 6.6 09/25/2018   HGB 10.2 (L) 09/25/2018   HCT 29.2 (L) 09/25/2018   MCV 87.7 09/25/2018   PLT 99 (L) 09/25/2018      Component Value Date/Time   NA 136 09/25/2018 0025   K 4.1 09/25/2018 0025   CL 102 09/25/2018 0025   CO2 21 (L) 09/25/2018 0025   GLUCOSE 148 (H) 09/25/2018 0025   BUN 10 09/25/2018 0025   CREATININE 0.77 09/25/2018 0025   CALCIUM 8.7 (L) 09/25/2018 0025   PROT 6.1 (L) 09/25/2018 0025   ALBUMIN 2.8 (L) 09/25/2018  0025   AST 30 09/25/2018 0025   ALT 23 09/25/2018 0025   ALKPHOS 84 09/25/2018 0025   BILITOT 0.5 09/25/2018 0025   GFRNONAA >60 09/25/2018 0025   GFRAA >60 09/25/2018 0025   No results found for: "CHOL", "HDL", "LDLCALC", "LDLDIRECT", "TRIG", "CHOLHDL" Lab Results  Component Value Date   HGBA1C 6.1 (H) 09/25/2018   Lab Results  Component Value Date   SAYTKZSW10 932 (H) 09/24/2018   No results found for: "TSH"  Outside lab review, done on 11/27/2021, TSH 2.17, and hemoglobin A1c 6.5  His MRI brain without contrast December 28, 2021 No acute intracranial abnormality Generalized volume loss and findings of chronic small vessel disease    ASSESSMENT AND PLAN  88 y.o. year old male with hypertension, hyperlipidemia, diabetes mellitus who is presenting with memory deficit for the past 2 to 3 years described as being forgetful, repeating himself, period of confusion, he is relying on wife to perform most activities of daily living.  Wife took over the bills because patient has been making mistakes.  Due to language barrier, he was difficult to obtain the Moca but based on history patient likely has mild dementia likely Alzheimer disease.  I will start him with Aricept 10 mg nightly.  Advised family to start with half a tablet nightly for 1 month then increase to full tablet.  Advised him to remain active, continue with exercise, continue his with his previous medications and to continue to follow with his PCP.  I will see him in 1 year for follow-up or sooner if worse.  They voiced understanding     1. Mild late onset Alzheimer's dementia without behavioral disturbance, psychotic disturbance, mood disturbance, or anxiety ([place])      Patient Instructions  Start with Aricept 1/2 tablet at bedtime for 4 weeks then increase to full tablet  Continue your other medications Follow up in 1 year or sooner if worse     There are well-accepted and sensible ways  to reduce risk for Alzheimers  disease and other degenerative brain disorders .  Exercise Daily Walk A daily 20 minute walk should be part of your routine. Disease related apathy can be a significant roadblock to exercise and the only way to overcome this is to make it a daily routine and perhaps have a reward at the end (something your loved one loves to eat or drink perhaps) or a personal trainer coming to the home can also be very useful. Most importantly, the patient is much more likely to exercise if the caregiver / spouse does it with him/her. In general a structured, repetitive schedule is best.  General Health: Any diseases which effect your body will effect your brain such as a pneumonia, urinary infection, blood clot, heart attack or stroke. Keep contact with your primary care doctor for regular follow ups.  Sleep. A good nights sleep is healthy for the brain. Seven hours is recommended. If you have insomnia or poor sleep habits we can give you some instructions. If you have sleep apnea wear your mask.  Diet: Eating a heart healthy diet is also a good idea; fish and poultry instead of red meat, nuts (mostly non-peanuts), vegetables, fruits, olive oil or canola oil (instead of butter), minimal salt (use other spices to flavor foods), whole grain rice, bread, cereal and pasta and wine in moderation.Research is now showing that the MIND diet, which is a combination of The Mediterranean diet and the DASH diet, is beneficial for cognitive processing and longevity. Information about this diet can be found in The MIND Diet, a book by Doyne Keel, MS, RDN, and online at [employer]book[employer].si  Finances, Power of Attorney and Advance Directives: You should consider putting legal safeguards in place with regard to financial and medical decision making. While the spouse always has power of attorney for medical and financial issues in the absence of any form, you should consider what you want in case the spouse /  caregiver is no longer around or capable of making decisions.   No orders of the defined types were placed in this encounter.   Meds ordered this encounter  Medications   donepezil (ARICEPT) 10 MG tablet    Sig: Take 1 tablet (10 mg total) by mouth at bedtime.    Dispense:  30 tablet    Refill:  3    Return in about 1 year (around 02/09/2023).  I have spent a total of 65 minutes dedicated to this patient today, preparing to see patient, performing a medically appropriate examination and evaluation, ordering tests and/or medications and procedures, and counseling and educating the patient/family/caregiver; independently interpreting result and communicating results to the family/patient/caregiver; and documenting clinical information in the electronic medical record.   Alric Ran, MD 02/08/2022, 4:58 PM  [facility] [address] [place], [place] [address] [phone]

## 2022-02-18 ENCOUNTER — Other Ambulatory Visit: Payer: Self-pay | Admitting: Neurology

## 2022-02-18 ENCOUNTER — Encounter: Payer: Self-pay | Admitting: Neurology

## 2022-02-18 MED ORDER — ADLARITY 5 MG/DAY TD PTWK: 1.0000 | MEDICATED_PATCH | TRANSDERMAL | 3 refills | Status: DC

## 2022-02-23 ENCOUNTER — Other Ambulatory Visit: Payer: Self-pay | Admitting: Neurology

## 2022-03-02 ENCOUNTER — Encounter: Payer: Self-pay | Admitting: Neurology

## 2022-03-02 ENCOUNTER — Telehealth: Payer: Self-pay

## 2022-03-02 NOTE — Telephone Encounter (Signed)
A PA was started on CMM for Adlarity '5MG'$ /DAY weekly patches (Key: [id number]). It has been submitted and is under review.

## 2022-03-11 NOTE — Telephone Encounter (Signed)
Adlarity is denied because it is not on your plan's Drug List (formulary). Medication authorization requires the following: (1) You need to try one (1) of these covered drugs: (a) Galantamine. (b) Rivastigmine patch*.

## 2022-03-15 ENCOUNTER — Telehealth: Payer: Self-pay

## 2022-03-15 ENCOUNTER — Other Ambulatory Visit: Payer: Self-pay | Admitting: Neurology

## 2022-03-15 MED ORDER — RIVASTIGMINE 4.6 MG/24HR TD PT24: 4.6000 mg | MEDICATED_PATCH | Freq: Every day | TRANSDERMAL | 3 refills | Status: DC

## 2022-03-15 NOTE — Progress Notes (Signed)
Donepezil patch not approved. We will try rivastigmine patch 4.6 mg for 3 months and if tolerated, then we will increase to 9.   Dr. April Manson

## 2022-03-15 NOTE — Telephone Encounter (Signed)
Sent patient mychart message

## 2022-03-15 NOTE — Telephone Encounter (Signed)
Please call  and inform patient that we will try Rivastigmine patch.   Dr. April Manson

## 2022-03-15 NOTE — Telephone Encounter (Signed)
PA Response - Approved. This request has received a Favorable outcome.  RIVASTIGMINE DIS 4.'6MG'$ /24, use as directed, is approved through 06/14/2023 under your Medicare Part D benefit.

## 2022-03-15 NOTE — Telephone Encounter (Signed)
Please contact patient and informed them that we are going to try the Rivastigmine patch.   Dr. April Manson

## 2022-03-15 NOTE — Telephone Encounter (Signed)
A PA for Rivastigmine 4.'6MG'$ /24HR 24 hr patches has been started on CMM. ([employer]low) - [id number].  [facility] is reviewing your PA request.

## 2022-05-06 ENCOUNTER — Other Ambulatory Visit: Payer: Self-pay | Admitting: Neurology

## 2022-05-31 DIAGNOSIS — E118 Type 2 diabetes mellitus with unspecified complications: Secondary | ICD-10-CM | POA: Diagnosis not present

## 2022-05-31 DIAGNOSIS — R413 Other amnesia: Secondary | ICD-10-CM | POA: Diagnosis not present

## 2022-05-31 DIAGNOSIS — I10 Essential (primary) hypertension: Secondary | ICD-10-CM | POA: Diagnosis not present

## 2022-11-16 ENCOUNTER — Other Ambulatory Visit: Payer: Self-pay | Admitting: Neurology

## 2022-12-10 DIAGNOSIS — H40013 Open angle with borderline findings, low risk, bilateral: Secondary | ICD-10-CM | POA: Diagnosis not present

## 2022-12-10 DIAGNOSIS — E119 Type 2 diabetes mellitus without complications: Secondary | ICD-10-CM | POA: Diagnosis not present

## 2022-12-10 DIAGNOSIS — H04123 Dry eye syndrome of bilateral lacrimal glands: Secondary | ICD-10-CM | POA: Diagnosis not present

## 2022-12-13 ENCOUNTER — Other Ambulatory Visit: Payer: Self-pay | Admitting: Neurology

## 2022-12-23 DIAGNOSIS — E118 Type 2 diabetes mellitus with unspecified complications: Secondary | ICD-10-CM | POA: Diagnosis not present

## 2022-12-28 DIAGNOSIS — E118 Type 2 diabetes mellitus with unspecified complications: Secondary | ICD-10-CM | POA: Diagnosis not present

## 2022-12-28 DIAGNOSIS — G301 Alzheimer's disease with late onset: Secondary | ICD-10-CM | POA: Diagnosis not present

## 2022-12-28 DIAGNOSIS — F02A0 Dementia in other diseases classified elsewhere, mild, without behavioral disturbance, psychotic disturbance, mood disturbance, and anxiety: Secondary | ICD-10-CM | POA: Diagnosis not present

## 2022-12-28 DIAGNOSIS — E785 Hyperlipidemia, unspecified: Secondary | ICD-10-CM | POA: Diagnosis not present

## 2022-12-28 DIAGNOSIS — D649 Anemia, unspecified: Secondary | ICD-10-CM | POA: Diagnosis not present

## 2022-12-28 DIAGNOSIS — Z0000 Encounter for general adult medical examination without abnormal findings: Secondary | ICD-10-CM | POA: Diagnosis not present

## 2022-12-28 DIAGNOSIS — J439 Emphysema, unspecified: Secondary | ICD-10-CM | POA: Diagnosis not present

## 2023-02-09 ENCOUNTER — Encounter: Payer: Self-pay | Admitting: Neurology

## 2023-02-09 ENCOUNTER — Ambulatory Visit: Payer: Medicare Other | Admitting: Neurology

## 2023-02-09 VITALS — BP 108/66 | Ht 65.0 in | Wt 129.0 lb

## 2023-02-09 DIAGNOSIS — G301 Alzheimer's disease with late onset: Secondary | ICD-10-CM

## 2023-02-09 DIAGNOSIS — F02A0 Dementia in other diseases classified elsewhere, mild, without behavioral disturbance, psychotic disturbance, mood disturbance, and anxiety: Secondary | ICD-10-CM | POA: Diagnosis not present

## 2023-02-09 MED ORDER — RIVASTIGMINE 9.5 MG/24HR TD PT24: 9.5000 mg | MEDICATED_PATCH | Freq: Every day | TRANSDERMAL | 11 refills | Status: DC

## 2023-02-09 NOTE — Progress Notes (Signed)
[facility]  PATIENT: Martin Bates DOB: August 02, 1930  REQUESTING CLINICIAN: Merri Brunette, MD HISTORY FROM: Patient, spouse and daughter  REASON FOR VISIT: Memory problems Interpretor #: [phone]   HISTORICAL  CHIEF COMPLAINT:  Chief Complaint  Patient presents with   Follow-up    Rm 13 f/u memory, wife and patient feel memory has been about the same since last visit, no falls. Translator machine used during visit, [id number]   INTERVAL HISTORY 02/09/2023 Patient presents today for follow-up, he is accompanied by wife and daughter.  Last visit was a year ago.  Since then family reports that he has been the same, they deny any irritability, trying to leave the house or difficulty sleeping.  Wife reports that he is not as social as previously, does not discussed with his church members as previously.  Feels like memory is about the same,  has not gotten worse but there is still confusion and needing to repeat herself.   HISTORY OF PRESENT ILLNESS:  This is a 89 year old gentleman past medical history of hyperlipidemia, hypertension, diabetes mellitus who is presenting with memory problem.  Patient feels like he is just mildly forgetful but otherwise he is doing well.  Patient does not have a lot of concern regarding his memory, he reports that he still drives, denies being lost in familiar places.  He is still able to manage his affairs and be independent.  Per family, mostly per wife, symptoms started 3 years ago after patient was admitted to ICU.  He presented to the hospital with fever, was thought to have COVID and admitted on the floor but was later found to be COVID-negative but had a UTI which developed into sepsis.  He was admitted to the ICU and since discharge from the hospital, his memory got worse with episodes of confusion.  Wife reports that 1 day he presented to one of his doctors office and did not know why he was there.  He does ask the same questions over and  over and repeat himself.  He is very dependent on his wife for activities of daily living.  When they drive, wife has to give him direction and help him navigate.  He is made mistake paying the bills and therefore wife took over the bills.  Other than that he is still very active in his community and home.     TBI:   No past history of TBI Stroke:   no past history of stroke Seizures:      no past history of seizures Sleep:   no history of sleep apnea.  Mood:   patient denies anxiety and depression  Functional status: independent in most ADLs Patient lives with Wife. Cooking: No  Cleaning: no Shopping: no Bathing: patient, no help needed  Toileting: patient, no help needed  Driving: patient, denies recent accident or being loss but wife assist her with direction. They drive together Bills: Wife took over because patient made mistakes in the past   Ever left the stove on by accident?: Denies Forget how to use items around the house?: Denies Getting lost going to familiar places?: Denies but wife help him with driving Forgetting loved ones names?: Denies Word finding difficulty? Denies Sleep: Good   OTHER MEDICAL CONDITIONS: Diabetes, Hyperlipidemia    REVIEW OF SYSTEMS: Full 14 system review of systems performed and negative with exception of: As noted in the HPI  ALLERGIES: No Known Allergies  HOME MEDICATIONS: Outpatient Medications Prior to Visit  Medication  Sig Dispense Refill   acetaminophen (TYLENOL) 500 MG tablet Take 1,000 mg by mouth every 6 (six) hours as needed for mild pain.     Ascorbic Acid (VITAMIN C) 1000 MG tablet Take 500 mg by mouth daily.      aspirin 81 MG tablet Take 81 mg by mouth daily.     atorvastatin (LIPITOR) 10 MG tablet Take 10 mg by mouth daily.      CALCIUM CITRATE-VITAMIN D PO Take 1 tablet by mouth 2 (two) times daily. 600 mg     loperamide (IMODIUM) 2 MG capsule Take 1 capsule (2 mg total) by mouth as needed for diarrhea or loose stools. 14  capsule 0   metFORMIN (GLUCOPHAGE) 1000 MG tablet Take 500-1,000 mg by mouth 2 (two) times daily with a meal. 1000 mg in the morning and 500 mg in the evening     Multiple Vitamin (MULTIVITAMIN) capsule Take 1 capsule by mouth daily.     Omega-3 Fatty Acids (FISH OIL) 1000 MG CAPS Take 1 capsule by mouth every morning.      rivastigmine (EXELON) 4.6 mg/24hr APPLY 1 PATCH TOPICALLY ONCE DAILY (Patient not taking: Reported on 02/09/2023) 30 patch 0   No facility-administered medications prior to visit.    PAST MEDICAL HISTORY: Past Medical History:  Diagnosis Date   Anemia    Normocytic   Cataracts, bilateral    COPD (chronic obstructive pulmonary disease) (HCC)    Hyperlipidemia    Internal hemorrhoids    Prostate cancer (HCC)    Pulmonary emphysema (HCC)    patient denies   Tubulovillous adenoma of colon 2005   Uncontrolled diabetes mellitus with microalbuminuria or microproteinuria     PAST SURGICAL HISTORY: Past Surgical History:  Procedure Laterality Date   COLONOSCOPY W/ POLYPECTOMY  03/30/2017   CRYOABLATION N/A 10/24/2017   Procedure: CRYO ABLATION PROSTATE;  Surgeon: Crista Elliot, MD;  Location: [facility];  Service: Urology;  Laterality: N/A;   PROSTATECTOMY  2008    FAMILY HISTORY: Family History  Problem Relation Age of Onset   Colon cancer Father    Stomach cancer Mother    Esophageal cancer Neg Hx     SOCIAL HISTORY: Social History   Socioeconomic History   Marital status: Married    Spouse name: Not on file   Number of children: 3   Years of education: Not on file   Highest education level: Not on file  Occupational History   Occupation: Retired  Tobacco Use   Smoking status: Former   Smokeless tobacco: Never  Advertising account planner   Vaping status: Never Used  Substance and Sexual Activity   Alcohol use: No   Drug use: No   Sexual activity: Not on file  Other Topics Concern   Not on file  Social History Narrative   Right handed    Caffeine-2 cups daily   Social Determinants of Health   Financial Resource Strain: Not on file  Food Insecurity: Not on file  Transportation Needs: Not on file  Physical Activity: Not on file  Stress: Not on file  Social Connections: Not on file  Intimate Partner Violence: Not on file    PHYSICAL EXAM  GENERAL EXAM/CONSTITUTIONAL: Vitals:  Vitals:   02/09/23 1019  BP: 108/66  Weight: 129 lb (58.5 kg)  Height: 5\' 5"  (1.651 m)    Body mass index is 21.47 kg/m. Wt Readings from Last 3 Encounters:  02/09/23 129 lb (58.5 kg)  02/08/22 129  lb 8 oz (58.7 kg)  09/22/18 126 lb 1.7 oz (57.2 kg)   Patient is in no distress; well developed, nourished and groomed; neck is supple  MUSCULOSKELETAL: Gait, strength, tone, movements noted in Neurologic exam below  NEUROLOGIC: MENTAL STATUS:      No data to display              02/08/2022    9:31 AM  Montreal Cognitive Assessment   Visuospatial/ Executive (0/5) 4  Naming (0/3) 3    awake, alert, oriented to person fund of knowledge appropriate Difficult to obtain Moca due to language barrier  CRANIAL NERVE:  2nd, 3rd, 4th, 6th - visual fields full to confrontation, extraocular muscles intact, no nystagmus 5th - facial sensation symmetric 7th - facial strength symmetric 8th - hearing intact 9th - palate elevates symmetrically, uvula midline 11th - shoulder shrug symmetric 12th - tongue protrusion midline  MOTOR:  normal bulk and tone, full strength in the BUE, BLE  SENSORY:  normal and symmetric to light touch, vibration  COORDINATION:  finger-nose-finger, fine finger movements normal  GAIT/STATION:  Wide base gait   DIAGNOSTIC DATA (LABS, IMAGING, TESTING) - I reviewed patient records, labs, notes, testing and imaging myself where available.  Lab Results  Component Value Date   WBC 6.6 09/25/2018   HGB 10.2 (L) 09/25/2018   HCT 29.2 (L) 09/25/2018   MCV 87.7 09/25/2018   PLT 99 (L) 09/25/2018       Component Value Date/Time   NA 136 09/25/2018 0025   K 4.1 09/25/2018 0025   CL 102 09/25/2018 0025   CO2 21 (L) 09/25/2018 0025   GLUCOSE 148 (H) 09/25/2018 0025   BUN 10 09/25/2018 0025   CREATININE 0.77 09/25/2018 0025   CALCIUM 8.7 (L) 09/25/2018 0025   PROT 6.1 (L) 09/25/2018 0025   ALBUMIN 2.8 (L) 09/25/2018 0025   AST 30 09/25/2018 0025   ALT 23 09/25/2018 0025   ALKPHOS 84 09/25/2018 0025   BILITOT 0.5 09/25/2018 0025   GFRNONAA >60 09/25/2018 0025   GFRAA >60 09/25/2018 0025   No results found for: "CHOL", "HDL", "LDLCALC", "LDLDIRECT", "TRIG", "CHOLHDL" Lab Results  Component Value Date   HGBA1C 6.1 (H) 09/25/2018   Lab Results  Component Value Date   VITAMINB12 963 (H) 09/24/2018   No results found for: "TSH"  Outside lab review, done on 11/27/2021, TSH 2.17, and hemoglobin A1c 6.5  His MRI brain without contrast December 28, 2021 No acute intracranial abnormality Generalized volume loss and findings of chronic small vessel disease    ASSESSMENT AND PLAN  89 y.o. year old male with hypertension, hyperlipidemia, diabetes mellitus who is presenting for follow up.  Patient likely has Alzheimer type dementia.  Will obtain ATN profile to look for AD biomarker's.  He could not tolerate Aricept and was put on rivastigmine patch.  Will continue the same medication.  Continue to follow with PCP, continue increasing exercise and I will see in 1 year for follow-up or sooner if worse.    1. Mild late onset Alzheimer's dementia without behavioral disturbance, psychotic disturbance, mood disturbance, or anxiety (HCC)     Patient Instructions  Increase rivastigmine patch to 9 mg  ATN profile to look for Alzheimer disease biomarker Continue your other medications  Continue to follow up with PCP and return in a year   Orders Placed This Encounter  Procedures   ATN PROFILE    Meds ordered this encounter  Medications   DISCONTD: rivastigmine (  EXELON) 9.5 mg/24hr     Sig: Place 1 patch (9.5 mg total) onto the skin daily.    Dispense:  30 patch    Refill:  11   rivastigmine (EXELON) 9.5 mg/24hr    Sig: Place 1 patch (9.5 mg total) onto the skin daily.    Dispense:  30 patch    Refill:  11    Return in about 1 year (around 02/09/2024).  I have spent a total of 45 minutes dedicated to this patient today, preparing to see patient, performing a medically appropriate examination and evaluation, ordering tests and/or medications and procedures, and counseling and educating the patient/family/caregiver; independently interpreting result and communicating results to the family/patient/caregiver; and documenting clinical information in the electronic medical record.   Windell Norfolk, MD 02/09/2023, 11:38 AM  [facility] [address], [address] [place], [place] [address] [phone]

## 2023-02-09 NOTE — Patient Instructions (Signed)
Increase rivastigmine patch to 9 mg  ATN profile to look for Alzheimer disease biomarker Continue your other medications  Continue to follow up with PCP and return in a year

## 2023-02-12 LAB — ATN PROFILE
A -- Beta-amyloid 42/40 Ratio: 0.085 — ABNORMAL LOW (ref >0.102)
Beta-amyloid 40: 136.96 pg/mL
Beta-amyloid 42: 11.62 pg/mL
N -- NfL, Plasma: 3.52 pg/mL (ref 0.00–11.55)
T -- p-tau181: 1.39 pg/mL — ABNORMAL HIGH (ref 0.00–0.97)

## 2023-02-15 ENCOUNTER — Telehealth: Payer: Self-pay

## 2023-02-15 NOTE — Progress Notes (Signed)
Please call and advise the patient/spouse that the recent labs we checked showed evidence of Alzheimer disease biomarker's. This test confirm that you most likely have Alzheimer dementia. Please continue with medicaation as directed.  Please remind patient to keep any upcoming appointments or tests and to call us with any interim questions, concerns, problems or updates. Thanks,  (will need [place] interpretor)   Windell Norfolk, MD

## 2023-02-15 NOTE — Telephone Encounter (Signed)
Daughter in law [place] called for the pt's results. She states that he can not speak English so she can be called with results. Please call back on [phone]

## 2023-02-15 NOTE — Telephone Encounter (Signed)
-----   Message from [facility] sent at 02/15/2023  9:53 AM EDT ----- Please call and advise the patient/spouse that the recent labs we checked showed evidence of Alzheimer disease biomarker's. This test confirm that you most likely have Alzheimer dementia. Please continue with medicaation as directed.  Please remind patient to keep any upcoming appointments or tests and to call us with any interim questions, concerns, problems or updates. Thanks,  (will need [place] interpretor)   Windell Norfolk, MD

## 2023-02-15 NOTE — Telephone Encounter (Signed)
Called and spoke to [place] per DPR, answered all questions and made sure there was understanding. She has no questions at this time and encouraged to call back if any questions or concerns arise.

## 2023-02-15 NOTE — Telephone Encounter (Signed)
No answer, left message to return call

## 2023-02-15 NOTE — Telephone Encounter (Signed)
Lvm- 2nd attempt

## 2023-03-31 DIAGNOSIS — E1169 Type 2 diabetes mellitus with other specified complication: Secondary | ICD-10-CM | POA: Diagnosis not present

## 2023-03-31 DIAGNOSIS — I10 Essential (primary) hypertension: Secondary | ICD-10-CM | POA: Diagnosis not present

## 2023-03-31 DIAGNOSIS — E785 Hyperlipidemia, unspecified: Secondary | ICD-10-CM | POA: Diagnosis not present

## 2023-05-05 ENCOUNTER — Encounter: Payer: Self-pay | Admitting: Neurology

## 2023-05-06 NOTE — Telephone Encounter (Signed)
Please have them contact their PCP for referral to social worker as we don't have anyone in the office.

## 2023-12-26 DIAGNOSIS — E118 Type 2 diabetes mellitus with unspecified complications: Secondary | ICD-10-CM | POA: Diagnosis not present

## 2023-12-26 DIAGNOSIS — H353131 Nonexudative age-related macular degeneration, bilateral, early dry stage: Secondary | ICD-10-CM | POA: Diagnosis not present

## 2023-12-26 DIAGNOSIS — I10 Essential (primary) hypertension: Secondary | ICD-10-CM | POA: Diagnosis not present

## 2023-12-26 DIAGNOSIS — H40013 Open angle with borderline findings, low risk, bilateral: Secondary | ICD-10-CM | POA: Diagnosis not present

## 2023-12-26 DIAGNOSIS — E113293 Type 2 diabetes mellitus with mild nonproliferative diabetic retinopathy without macular edema, bilateral: Secondary | ICD-10-CM | POA: Diagnosis not present

## 2023-12-26 DIAGNOSIS — E785 Hyperlipidemia, unspecified: Secondary | ICD-10-CM | POA: Diagnosis not present

## 2023-12-26 DIAGNOSIS — H04123 Dry eye syndrome of bilateral lacrimal glands: Secondary | ICD-10-CM | POA: Diagnosis not present

## 2023-12-30 DIAGNOSIS — G301 Alzheimer's disease with late onset: Secondary | ICD-10-CM | POA: Diagnosis not present

## 2023-12-30 DIAGNOSIS — E11319 Type 2 diabetes mellitus with unspecified diabetic retinopathy without macular edema: Secondary | ICD-10-CM | POA: Diagnosis not present

## 2023-12-30 DIAGNOSIS — I10 Essential (primary) hypertension: Secondary | ICD-10-CM | POA: Diagnosis not present

## 2023-12-30 DIAGNOSIS — L821 Other seborrheic keratosis: Secondary | ICD-10-CM | POA: Diagnosis not present

## 2023-12-30 DIAGNOSIS — I119 Hypertensive heart disease without heart failure: Secondary | ICD-10-CM | POA: Diagnosis not present

## 2023-12-30 DIAGNOSIS — Z0000 Encounter for general adult medical examination without abnormal findings: Secondary | ICD-10-CM | POA: Diagnosis not present

## 2024-01-02 ENCOUNTER — Other Ambulatory Visit: Payer: Self-pay

## 2024-01-25 ENCOUNTER — Other Ambulatory Visit: Payer: Self-pay | Admitting: Neurology

## 2024-02-09 ENCOUNTER — Ambulatory Visit: Payer: Medicare Other | Admitting: Neurology

## 2024-02-09 ENCOUNTER — Encounter: Payer: Self-pay | Admitting: Neurology

## 2024-02-09 VITALS — BP 124/68 | HR 67 | Ht 64.0 in | Wt 125.0 lb

## 2024-02-09 DIAGNOSIS — G301 Alzheimer's disease with late onset: Secondary | ICD-10-CM

## 2024-02-09 DIAGNOSIS — F02A0 Dementia in other diseases classified elsewhere, mild, without behavioral disturbance, psychotic disturbance, mood disturbance, and anxiety: Secondary | ICD-10-CM | POA: Diagnosis not present

## 2024-02-09 MED ORDER — MEMANTINE HCL 10 MG PO TABS: 10.0000 mg | ORAL_TABLET | Freq: Two times a day (BID) | ORAL | 4 refills | Status: AC

## 2024-02-09 MED ORDER — RIVASTIGMINE 9.5 MG/24HR TD PT24: 9.5000 mg | MEDICATED_PATCH | Freq: Every day | TRANSDERMAL | 11 refills | Status: DC

## 2024-02-09 MED ORDER — RIVASTIGMINE 9.5 MG/24HR TD PT24: 9.5000 mg | MEDICATED_PATCH | Freq: Every day | TRANSDERMAL | 11 refills | Status: AC

## 2024-02-09 NOTE — Progress Notes (Signed)
 [facility]  PATIENT: Martin Bates DOB: 06-09-1931  REQUESTING CLINICIAN: Clarice Nottingham, MD HISTORY FROM: Patient, spouse and daughter  REASON FOR VISIT: Memory problems Interpretor #: [phone]   HISTORICAL  CHIEF COMPLAINT:  Chief Complaint  Patient presents with   Follow-up    Rm 13, with wife and daughter in law, memory f/u, no changes reported   INTERVAL HISTORY 02/09/2024 Patient presents today for follow-up, he is accompanied by wife and daughter.  Last visit was a year ago, since then family tells me that he has been the same, they have not noted any severe worsening in the memory.  He still needs guidance in self-care, reminder to shower, change his clothes, brushes his teeth.  His appetite is good.  He is not wandering outside the house, denies any irritability.  Sleep is good.  Today he was not able to tell me the name of his wife or acknowledged his daughter, but when given multiple-choices, he said that was his daughter.  INTERVAL HISTORY 02/09/2023 Patient presents today for follow-up, he is accompanied by wife and daughter.  Last visit was a year ago.  Since then family reports that he has been the same, they deny any irritability, trying to leave the house or difficulty sleeping.  Wife reports that he is not as social as previously, does not discussed with his church members as previously.  Feels like memory is about the same,  has not gotten worse but there is still confusion and needing to repeat herself.   HISTORY OF PRESENT ILLNESS:  This is a 89 year old gentleman past medical history of hyperlipidemia, hypertension, diabetes mellitus who is presenting with memory problem.  Patient feels like he is just mildly forgetful but otherwise he is doing well.  Patient does not have a lot of concern regarding his memory, he reports that he still drives, denies being lost in familiar places.  He is still able to manage his affairs and be independent.  Per  family, mostly per wife, symptoms started 3 years ago after patient was admitted to ICU.  He presented to the hospital with fever, was thought to have COVID and admitted on the floor but was later found to be COVID-negative but had a UTI which developed into sepsis.  He was admitted to the ICU and since discharge from the hospital, his memory got worse with episodes of confusion.  Wife reports that 1 day he presented to one of his doctors office and did not know why he was there.  He does ask the same questions over and over and repeat himself.  He is very dependent on his wife for activities of daily living.  When they drive, wife has to give him direction and help him navigate.  He is made mistake paying the bills and therefore wife took over the bills.  Other than that he is still very active in his community and home.     TBI:   No past history of TBI Stroke:   no past history of stroke Seizures:      no past history of seizures Sleep:   no history of sleep apnea.  Mood:   patient denies anxiety and depression  Functional status: independent in most ADLs Patient lives with Wife. Cooking: No  Cleaning: no Shopping: no Bathing: patient, no help needed  Toileting: patient, no help needed  Driving: patient, denies recent accident or being loss but wife assist her with direction. They drive together Bills: Wife took  over because patient made mistakes in the past   Ever left the stove on by accident?: Denies Forget how to use items around the house?: Denies Getting lost going to familiar places?: Denies but wife help him with driving Forgetting loved ones names?: Denies Word finding difficulty? Denies Sleep: Good   OTHER MEDICAL CONDITIONS: Diabetes, Hyperlipidemia    REVIEW OF SYSTEMS: Full 14 system review of systems performed and negative with exception of: As noted in the HPI  ALLERGIES: No Known Allergies  HOME MEDICATIONS: Outpatient Medications Prior to Visit  Medication Sig  Dispense Refill   acetaminophen  (TYLENOL ) 500 MG tablet Take 1,000 mg by mouth every 6 (six) hours as needed for mild pain.     Ascorbic Acid (VITAMIN C) 1000 MG tablet Take 500 mg by mouth daily.      aspirin  81 MG tablet Take 81 mg by mouth daily.     atorvastatin  (LIPITOR) 10 MG tablet Take 10 mg by mouth daily.      CALCIUM  CITRATE-VITAMIN D PO Take 1 tablet by mouth 2 (two) times daily. 600 mg     loperamide  (IMODIUM ) 2 MG capsule Take 1 capsule (2 mg total) by mouth as needed for diarrhea or loose stools. 14 capsule 0   metFORMIN (GLUCOPHAGE) 1000 MG tablet Take 500-1,000 mg by mouth 2 (two) times daily with a meal. 1000 mg in the morning and 500 mg in the evening     Multiple Vitamin (MULTIVITAMIN) capsule Take 1 capsule by mouth daily.     Omega-3 Fatty Acids (FISH OIL) 1000 MG CAPS Take 1 capsule by mouth every morning.      rivastigmine  (EXELON ) 9.5 mg/24hr APPLY 1 PATCH TOPICALLY ONCE DAILY 30 patch 0   No facility-administered medications prior to visit.    PAST MEDICAL HISTORY: Past Medical History:  Diagnosis Date   Anemia    Normocytic   Cataracts, bilateral    COPD (chronic obstructive pulmonary disease) (HCC)    Hyperlipidemia    Internal hemorrhoids    Prostate cancer (HCC)    Pulmonary emphysema (HCC)    patient denies   Tubulovillous adenoma of colon 2005   Uncontrolled diabetes mellitus with microalbuminuria or microproteinuria     PAST SURGICAL HISTORY: Past Surgical History:  Procedure Laterality Date   COLONOSCOPY W/ POLYPECTOMY  03/30/2017   CRYOABLATION N/A 10/24/2017   Procedure: CRYO ABLATION PROSTATE;  Surgeon: Carolee Sherwood JONETTA DOUGLAS, MD;  Location: [facility];  Service: Urology;  Laterality: N/A;   PROSTATECTOMY  2008    FAMILY HISTORY: Family History  Problem Relation Age of Onset   Colon cancer Father    Stomach cancer Mother    Esophageal cancer Neg Hx     SOCIAL HISTORY: Social History   Socioeconomic History   Marital  status: Married    Spouse name: Not on file   Number of children: 3   Years of education: Not on file   Highest education level: Not on file  Occupational History   Occupation: Retired  Tobacco Use   Smoking status: Former   Smokeless tobacco: Never  Advertising account planner   Vaping status: Never Used  Substance and Sexual Activity   Alcohol use: No   Drug use: No   Sexual activity: Not on file  Other Topics Concern   Not on file  Social History Narrative   Right handed   Caffeine-2 cups daily   Social Drivers of Corporate investment banker Strain: Not on file  Food Insecurity: Not on file  Transportation Needs: Not on file  Physical Activity: Not on file  Stress: Not on file  Social Connections: Not on file  Intimate Partner Violence: Not on file    PHYSICAL EXAM  GENERAL EXAM/CONSTITUTIONAL: Vitals:  Vitals:   02/09/24 1012  BP: 124/68  Pulse: 67  SpO2: 97%  Weight: 125 lb (56.7 kg)  Height: 5' 4 (1.626 m)    Body mass index is 21.46 kg/m. Wt Readings from Last 3 Encounters:  02/09/24 125 lb (56.7 kg)  02/09/23 129 lb (58.5 kg)  02/08/22 129 lb 8 oz (58.7 kg)   Patient is in no distress; well developed, nourished and groomed; neck is supple  MUSCULOSKELETAL: Gait, strength, tone, movements noted in Neurologic exam below  NEUROLOGIC: MENTAL STATUS:      No data to display              02/08/2022    9:31 AM  Montreal Cognitive Assessment   Visuospatial/ Executive (0/5) 4  Naming (0/3) 3   awake, alert, oriented to person fund of knowledge appropriate Difficult to obtain Moca due to language barrier Unable to tell me the name of his wife  When ask about his daughter, he said, I don't know, could not tell me the name.  Was able to identify thumb but not knuckles or the color blue of his shirt  CRANIAL NERVE:  2nd, 3rd, 4th, 6th - visual fields full to confrontation, extraocular muscles intact, no nystagmus 5th - facial sensation symmetric 7th -  facial strength symmetric 8th - hearing intact 9th - palate elevates symmetrically, uvula midline 11th - shoulder shrug symmetric 12th - tongue protrusion midline  MOTOR:  normal bulk and tone, full strength in the BUE, BLE  SENSORY:  normal and symmetric to light touch, vibration  COORDINATION:  finger-nose-finger, fine finger movements normal  GAIT/STATION:  Wide base gait   DIAGNOSTIC DATA (LABS, IMAGING, TESTING) - I reviewed patient records, labs, notes, testing and imaging myself where available.  Lab Results  Component Value Date   WBC 6.6 09/25/2018   HGB 10.2 (L) 09/25/2018   HCT 29.2 (L) 09/25/2018   MCV 87.7 09/25/2018   PLT 99 (L) 09/25/2018      Component Value Date/Time   NA 136 09/25/2018 0025   K 4.1 09/25/2018 0025   CL 102 09/25/2018 0025   CO2 21 (L) 09/25/2018 0025   GLUCOSE 148 (H) 09/25/2018 0025   BUN 10 09/25/2018 0025   CREATININE 0.77 09/25/2018 0025   CALCIUM  8.7 (L) 09/25/2018 0025   PROT 6.1 (L) 09/25/2018 0025   ALBUMIN 2.8 (L) 09/25/2018 0025   AST 30 09/25/2018 0025   ALT 23 09/25/2018 0025   ALKPHOS 84 09/25/2018 0025   BILITOT 0.5 09/25/2018 0025   GFRNONAA >60 09/25/2018 0025   GFRAA >60 09/25/2018 0025   No results found for: CHOL, HDL, LDLCALC, LDLDIRECT, TRIG, CHOLHDL Lab Results  Component Value Date   HGBA1C 6.1 (H) 09/25/2018   Lab Results  Component Value Date   VITAMINB12 963 (H) 09/24/2018   No results found for: TSH  Outside lab review, done on 11/27/2021, TSH 2.17, and hemoglobin A1c 6.5  ATN profile consistent with presence of Alzheimer Disease biomarkers  His MRI brain without contrast December 28, 2021 No acute intracranial abnormality Generalized volume loss and findings of chronic small vessel disease   ASSESSMENT AND PLAN  89 y.o. year old male with hypertension, hyperlipidemia, diabetes mellitus who is  presenting for follow up for his Alzheimer dementia.  He is on Exelon  patch, will  add Namenda  10 mg twice daily.  Advised family to contact me if there is new hallucination, new irritability, new change in behavior otherwise he can continue to follow with Dr. Clarice.  Return as needed.    1. Mild late onset Alzheimer's dementia without behavioral disturbance, psychotic disturbance, mood disturbance, or anxiety (HCC)      Patient Instructions  Continue with rivastigmine  patch Start Namenda  10 mg twice daily Continue your other medications Continue to follow with Dr. Clarice  Please contact me if there are new hallucinations, new irritability or new change in behavior.  No orders of the defined types were placed in this encounter.   Meds ordered this encounter  Medications   memantine  (NAMENDA ) 10 MG tablet    Sig: Take 1 tablet (10 mg total) by mouth 2 (two) times daily.    Dispense:  180 tablet    Refill:  4   DISCONTD: rivastigmine  (EXELON ) 9.5 mg/24hr    Sig: Place 1 patch (9.5 mg total) onto the skin daily.    Dispense:  30 patch    Refill:  11   rivastigmine  (EXELON ) 9.5 mg/24hr    Sig: Place 1 patch (9.5 mg total) onto the skin daily.    Dispense:  30 patch    Refill:  11    Return if symptoms worsen or fail to improve.  I personally spent a total of 40 minutes in the care of the patient today including preparing to see the patient, getting/reviewing separately obtained history, performing a medically appropriate exam/evaluation, counseling and educating, placing orders, and documenting clinical information in the EHR .  Pastor Falling, MD 02/09/2024, 12:15 PM  [facility] [address], [address] [place], [place] [address] [phone]

## 2024-02-09 NOTE — Patient Instructions (Signed)
 Continue with rivastigmine  patch Start Namenda  10 mg twice daily Continue your other medications Continue to follow with Dr. Clarice  Please contact me if there are new hallucinations, new irritability or new change in behavior.
